# Patient Record
Sex: Male | Born: 1945
Health system: Southern US, Community
[De-identification: ages and names within clinical notes are randomized; demographics above are authoritative.]

## PROBLEM LIST (undated history)

## (undated) DIAGNOSIS — T7840XA Allergy, unspecified, initial encounter: Secondary | ICD-10-CM

## (undated) DIAGNOSIS — E538 Deficiency of other specified B group vitamins: Secondary | ICD-10-CM

## (undated) DIAGNOSIS — K219 Gastro-esophageal reflux disease without esophagitis: Secondary | ICD-10-CM

## (undated) DIAGNOSIS — F419 Anxiety disorder, unspecified: Secondary | ICD-10-CM

## (undated) DIAGNOSIS — E785 Hyperlipidemia, unspecified: Secondary | ICD-10-CM

## (undated) DIAGNOSIS — M1990 Unspecified osteoarthritis, unspecified site: Secondary | ICD-10-CM

## (undated) DIAGNOSIS — G709 Myoneural disorder, unspecified: Secondary | ICD-10-CM

## (undated) HISTORY — DX: Deficiency of other specified B group vitamins: E53.8

## (undated) HISTORY — DX: Myoneural disorder, unspecified: G70.9

## (undated) HISTORY — PX: CHOLECYSTECTOMY: SHX55

## (undated) HISTORY — PX: COLONOSCOPY: SHX174

## (undated) HISTORY — DX: Gastro-esophageal reflux disease without esophagitis: K21.9

## (undated) HISTORY — DX: Unspecified osteoarthritis, unspecified site: M19.90

## (undated) HISTORY — DX: Anxiety disorder, unspecified: F41.9

## (undated) HISTORY — DX: Hyperlipidemia, unspecified: E78.5

## (undated) HISTORY — PX: POLYPECTOMY: SHX149

## (undated) HISTORY — DX: Allergy, unspecified, initial encounter: T78.40XA

---

## 2001-10-10 ENCOUNTER — Ambulatory Visit (HOSPITAL_COMMUNITY): Admission: RE | Admit: 2001-10-10 | Discharge: 2001-10-10 | Payer: Self-pay | Admitting: Internal Medicine

## 2004-05-01 ENCOUNTER — Ambulatory Visit: Payer: Self-pay

## 2010-10-13 ENCOUNTER — Ambulatory Visit: Payer: Self-pay | Admitting: Gastroenterology

## 2011-06-15 DIAGNOSIS — R972 Elevated prostate specific antigen [PSA]: Secondary | ICD-10-CM | POA: Diagnosis not present

## 2011-06-22 DIAGNOSIS — N401 Enlarged prostate with lower urinary tract symptoms: Secondary | ICD-10-CM | POA: Diagnosis not present

## 2011-06-22 DIAGNOSIS — R350 Frequency of micturition: Secondary | ICD-10-CM | POA: Diagnosis not present

## 2011-07-23 ENCOUNTER — Ambulatory Visit (INDEPENDENT_AMBULATORY_CARE_PROVIDER_SITE_OTHER): Payer: Medicare Other | Admitting: Internal Medicine

## 2011-07-23 VITALS — BP 136/81 | HR 89 | Temp 98.6°F | Resp 18 | Ht 74.0 in | Wt 190.0 lb

## 2011-07-23 DIAGNOSIS — J019 Acute sinusitis, unspecified: Secondary | ICD-10-CM | POA: Diagnosis not present

## 2011-07-23 DIAGNOSIS — J329 Chronic sinusitis, unspecified: Secondary | ICD-10-CM

## 2011-07-23 MED ORDER — AMOXICILLIN 500 MG PO CAPS: 1000.0000 mg | ORAL_CAPSULE | Freq: Two times a day (BID) | ORAL | Status: AC

## 2011-07-23 NOTE — Progress Notes (Signed)
  Subjective:    Patient ID: Peter Newman, male    DOB: 03-Jun-1945, 66 y.o.   MRN: [id number]  HPI  Green nasal discharge. No cough  Review of Systems     Objective:   Physical Exam Tender frontal sinuses. No fever and only mild HA.       Assessment & Plan:   Amoxil 1g BID

## 2011-12-26 DIAGNOSIS — N401 Enlarged prostate with lower urinary tract symptoms: Secondary | ICD-10-CM | POA: Diagnosis not present

## 2012-01-12 ENCOUNTER — Other Ambulatory Visit: Payer: Self-pay | Admitting: Physician Assistant

## 2012-01-13 NOTE — Telephone Encounter (Signed)
We've seen this patient once for a sinusitis.  Who is his PCP?  That person should refill his chronic meds.

## 2012-01-14 NOTE — Telephone Encounter (Signed)
LMOM for patient to CB.  Last seen and had labs in 08/2010.  When is he having CPE/appt?

## 2012-01-15 ENCOUNTER — Telehealth: Payer: Self-pay

## 2012-01-15 NOTE — Telephone Encounter (Signed)
Pt needs Vitamin D Multiple Vitamin (MULTIVITAMIN) tablet  [facility] - at [address]   Leaving town today

## 2012-01-16 NOTE — Telephone Encounter (Signed)
[facility] called regarding patient's rx.  The pharmacy stated that they sent over two refill requests last week and have had no response.  Please call the pharmacy at [phone].

## 2012-01-16 NOTE — Telephone Encounter (Signed)
Called patient again, no answer.  Will deny rx.

## 2012-01-17 ENCOUNTER — Telehealth: Payer: Self-pay | Admitting: Radiology

## 2012-01-17 MED ORDER — METANX 3-90.314-2-35 MG PO CAPS: 1.0000 | ORAL_CAPSULE | Freq: Two times a day (BID) | ORAL | Status: DC

## 2012-01-17 NOTE — Telephone Encounter (Signed)
I have spoken to [place] patient walked in for renewal on Metanx okay to renew x1mo but needs ov prior to another renewal. I have called patients home number to advise he needs follow up appt, no answer no vm.

## 2012-01-17 NOTE — Telephone Encounter (Signed)
?   Ok x 1, needs OV/CPE for more.  Last labs done 08/2010

## 2012-01-18 ENCOUNTER — Other Ambulatory Visit: Payer: Self-pay

## 2012-01-18 MED ORDER — METANX 3-90.314-2-35 MG PO CAPS: 1.0000 | ORAL_CAPSULE | Freq: Two times a day (BID) | ORAL | Status: DC

## 2012-02-14 ENCOUNTER — Ambulatory Visit (INDEPENDENT_AMBULATORY_CARE_PROVIDER_SITE_OTHER): Payer: Medicare Other | Admitting: Emergency Medicine

## 2012-02-14 VITALS — BP 112/62 | HR 79 | Temp 98.0°F | Resp 18 | Ht 73.25 in | Wt 189.0 lb

## 2012-02-14 DIAGNOSIS — E785 Hyperlipidemia, unspecified: Secondary | ICD-10-CM

## 2012-02-14 DIAGNOSIS — G589 Mononeuropathy, unspecified: Secondary | ICD-10-CM | POA: Diagnosis not present

## 2012-02-14 DIAGNOSIS — G629 Polyneuropathy, unspecified: Secondary | ICD-10-CM | POA: Insufficient documentation

## 2012-02-14 DIAGNOSIS — D72819 Decreased white blood cell count, unspecified: Secondary | ICD-10-CM | POA: Diagnosis not present

## 2012-02-14 LAB — POCT CBC
Granulocyte percent: 61 %G (ref 37–80)
HCT, POC: 50.3 % (ref 43.5–53.7)
Lymph, poc: 1.3 (ref 0.6–3.4)
MCHC: 31.6 g/dL — AB (ref 31.8–35.4)
MID (cbc): 0.3 (ref 0–0.9)
POC Granulocyte: 2.6 (ref 2–6.9)
POC LYMPH PERCENT: 31 %L (ref 10–50)
Platelet Count, POC: 213 10*3/uL (ref 142–424)
RDW, POC: 13.6 %

## 2012-02-14 LAB — LIPID PANEL
Cholesterol: 170 mg/dL (ref 0–200)
HDL: 51 mg/dL (ref >39)
Total CHOL/HDL Ratio: 3.3 Ratio
VLDL: 19 mg/dL (ref 0–40)

## 2012-02-14 LAB — POCT URINALYSIS DIPSTICK
Ketones, UA: NEGATIVE
Leukocytes, UA: NEGATIVE
Protein, UA: NEGATIVE
Spec Grav, UA: 1.02
Urobilinogen, UA: 0.2
pH, UA: 5.5

## 2012-02-14 LAB — COMPREHENSIVE METABOLIC PANEL
Alkaline Phosphatase: 72 U/L (ref 39–117)
CO2: 28 mEq/L (ref 19–32)
Creat: 1.11 mg/dL (ref 0.50–1.35)
Glucose, Bld: 99 mg/dL (ref 70–99)
Total Bilirubin: 0.4 mg/dL (ref 0.3–1.2)

## 2012-02-14 LAB — TSH: TSH: 2.015 u[IU]/mL (ref 0.350–4.500)

## 2012-02-14 MED ORDER — METANX 3-90.314-2-35 MG PO CAPS: 1.0000 | ORAL_CAPSULE | Freq: Two times a day (BID) | ORAL | Status: DC

## 2012-02-14 NOTE — Progress Notes (Signed)
  Subjective:    Patient ID: Peter Newman, male    DOB: [birth year], 66 y.o.   MRN: [id number]  HPI Patient in today to have his Vitamin B refilled. He states that he does real well on the medication. He is on the medication because he has had a deficiency on blood work. He had to cancel his last appointment with Dr. Perrin Maltese. He will reschedule. He also states that when he is in a dark room and he moves his head from side to side in the peripheral areas on both sides he see a light.     Review of Systems     Objective:   Physical Exam HEENT exam is unremarkable. Neck supple without carotid bruits. Chest was clear to auscultation. Cardiac exam is a regular rate without murmurs. Deep tendon reflexes in the lower extremities are 2+ and he has a normal filament test         Assessment & Plan:  Screening labs were done today for patient to have at the time of his physical. I will go ahead and refill his metanx. He was advised to make an appointment to see Dr. Perrin Maltese for full physical exam.

## 2012-02-14 NOTE — Patient Instructions (Signed)
Please make an appointment to see Dr. Perrin Maltese to have a physical exam done.

## 2012-02-20 DIAGNOSIS — H5319 Other subjective visual disturbances: Secondary | ICD-10-CM | POA: Diagnosis not present

## 2012-02-20 DIAGNOSIS — H43819 Vitreous degeneration, unspecified eye: Secondary | ICD-10-CM | POA: Diagnosis not present

## 2012-06-09 ENCOUNTER — Encounter: Payer: MEDICARE | Admitting: Internal Medicine

## 2012-08-25 ENCOUNTER — Ambulatory Visit: Payer: Medicare Other

## 2012-08-25 ENCOUNTER — Encounter: Payer: Self-pay | Admitting: Internal Medicine

## 2012-08-25 ENCOUNTER — Ambulatory Visit (INDEPENDENT_AMBULATORY_CARE_PROVIDER_SITE_OTHER): Payer: Medicare Other | Admitting: Internal Medicine

## 2012-08-25 VITALS — BP 113/71 | HR 83 | Temp 98.3°F | Resp 16 | Ht 73.5 in | Wt 194.0 lb

## 2012-08-25 DIAGNOSIS — Z139 Encounter for screening, unspecified: Secondary | ICD-10-CM | POA: Diagnosis not present

## 2012-08-25 DIAGNOSIS — Z23 Encounter for immunization: Secondary | ICD-10-CM | POA: Diagnosis not present

## 2012-08-25 DIAGNOSIS — K589 Irritable bowel syndrome without diarrhea: Secondary | ICD-10-CM

## 2012-08-25 DIAGNOSIS — Z0000 Encounter for general adult medical examination without abnormal findings: Secondary | ICD-10-CM | POA: Diagnosis not present

## 2012-08-25 DIAGNOSIS — E559 Vitamin D deficiency, unspecified: Secondary | ICD-10-CM | POA: Diagnosis not present

## 2012-08-25 LAB — COMPREHENSIVE METABOLIC PANEL
Albumin: 4.1 g/dL (ref 3.5–5.2)
Alkaline Phosphatase: 71 U/L (ref 39–117)
BUN: 18 mg/dL (ref 6–23)
Creat: 1.18 mg/dL (ref 0.50–1.35)
Glucose, Bld: 105 mg/dL — ABNORMAL HIGH (ref 70–99)
Total Bilirubin: 0.6 mg/dL (ref 0.3–1.2)

## 2012-08-25 LAB — LIPID PANEL
Cholesterol: 201 mg/dL — ABNORMAL HIGH (ref 0–200)
HDL: 53 mg/dL (ref >39)
LDL Cholesterol: 130 mg/dL — ABNORMAL HIGH (ref 0–99)
Triglycerides: 89 mg/dL (ref <150)
VLDL: 18 mg/dL (ref 0–40)

## 2012-08-25 LAB — CBC WITH DIFFERENTIAL/PLATELET
Basophils Relative: 1 % (ref 0–1)
Eosinophils Absolute: 0.1 10*3/uL (ref 0.0–0.7)
Eosinophils Relative: 2 % (ref 0–5)
HCT: 48.3 % (ref 39.0–52.0)
Hemoglobin: 16.4 g/dL (ref 13.0–17.0)
MCH: 30.7 pg (ref 26.0–34.0)
MCHC: 34 g/dL (ref 30.0–36.0)
Monocytes Absolute: 0.3 10*3/uL (ref 0.1–1.0)
Monocytes Relative: 8 % (ref 3–12)

## 2012-08-25 LAB — POCT URINALYSIS DIPSTICK
Ketones, UA: NEGATIVE
Leukocytes, UA: NEGATIVE
Protein, UA: NEGATIVE
Urobilinogen, UA: 0.2

## 2012-08-25 LAB — POCT UA - MICROSCOPIC ONLY
WBC, Ur, HPF, POC: NEGATIVE
Yeast, UA: NEGATIVE

## 2012-08-25 LAB — PULMONARY FUNCTION TEST

## 2012-08-25 NOTE — Progress Notes (Signed)
  Subjective:    Patient ID: Peter Newman, male    DOB: 01-12-1946, 67 y.o.   MRN: [id number]  HPI CPE healthy on no meds. Elevated PSA followed by urology  Review of Systems  Constitutional: Negative.   HENT: Negative.   Eyes: Negative.   Respiratory: Negative.   Cardiovascular: Negative.   Gastrointestinal: Negative.   Endocrine: Negative.   Genitourinary: Positive for frequency.  Musculoskeletal: Negative.   Allergic/Immunologic: Negative.   Neurological: Negative.   Hematological: Negative.   Psychiatric/Behavioral: Negative.        Objective:   Physical Exam  Constitutional: He is oriented to person, place, and time. He appears well-developed and well-nourished.  HENT:  Right Ear: External ear normal.  Left Ear: External ear normal.  Nose: Nose normal.  Mouth/Throat: Oropharynx is clear and moist.  Eyes: Conjunctivae and EOM are normal. Pupils are equal, round, and reactive to light. No scleral icterus.  Neck: Neck supple. No tracheal deviation present. No thyromegaly present.  Cardiovascular: Normal rate, regular rhythm, normal heart sounds and intact distal pulses.   Pulmonary/Chest: Effort normal and breath sounds normal.  Abdominal: Soft. Bowel sounds are normal. He exhibits no mass. There is no guarding.  Genitourinary: Penis normal.  Musculoskeletal: Normal range of motion.  Lymphadenopathy:    He has no cervical adenopathy.  Neurological: He is alert and oriented to person, place, and time. He has normal reflexes. No cranial nerve deficit. He exhibits normal muscle tone. Coordination normal.  Skin: No rash noted.  Psychiatric: He has a normal mood and affect. His behavior is normal. Judgment and thought content normal.     [facility] reading (PRIMARY) by  Dr Rodrigo Ran cxr Results for orders placed in visit on 08/25/12  POCT URINALYSIS DIPSTICK      Result Value Range   Color, UA yellow     Clarity, UA clear     Glucose, UA neg     Bilirubin, UA neg      Ketones, UA neg     Spec Grav, UA 1.025     Blood, UA neg     pH, UA 5.5     Protein, UA neg     Urobilinogen, UA 0.2     Nitrite, UA neg     Leukocytes, UA Negative    POCT UA - MICROSCOPIC ONLY      Result Value Range   WBC, Ur, HPF, POC neg     RBC, urine, microscopic neg     Bacteria, U Microscopic neg     Mucus, UA neg     Epithelial cells, urine per micros 0-1     Crystals, Ur, HPF, POC neg     Casts, Ur, LPF, POC neg     Yeast, UA neg      EKG and PFTs normal      Assessment & Plan:  cpe 1y

## 2012-08-26 ENCOUNTER — Encounter: Payer: Self-pay | Admitting: *Deleted

## 2013-02-08 DIAGNOSIS — Z23 Encounter for immunization: Secondary | ICD-10-CM | POA: Diagnosis not present

## 2013-03-12 ENCOUNTER — Telehealth: Payer: Self-pay | Admitting: Gastroenterology

## 2013-03-12 NOTE — Telephone Encounter (Signed)
Colon on 12/03/2003 was normal with a recall for 11/2013. lmom for pt to call back.

## 2013-03-17 NOTE — Telephone Encounter (Signed)
Informed pt he cannot schedule until July, 2015 unless he is having changes or problems in his GI system; pt stated understanding.

## 2013-09-14 ENCOUNTER — Encounter: Payer: Medicare Other | Admitting: Internal Medicine

## 2013-10-26 ENCOUNTER — Other Ambulatory Visit: Payer: Self-pay | Admitting: Internal Medicine

## 2013-10-26 ENCOUNTER — Ambulatory Visit (INDEPENDENT_AMBULATORY_CARE_PROVIDER_SITE_OTHER): Payer: Medicare Other | Admitting: Internal Medicine

## 2013-10-26 VITALS — BP 118/80 | HR 83 | Temp 98.5°F | Resp 16 | Ht 73.0 in | Wt 196.0 lb

## 2013-10-26 DIAGNOSIS — N644 Mastodynia: Secondary | ICD-10-CM

## 2013-10-26 MED ORDER — TRIAMCINOLONE ACETONIDE 0.1 % EX CREA: 1.0000 "application " | TOPICAL_CREAM | Freq: Two times a day (BID) | CUTANEOUS | Status: DC

## 2013-10-26 NOTE — Progress Notes (Signed)
   Subjective:    Patient ID: Peter Newman, male    DOB: 11-23-1945, 68 y.o.   MRN: [id number]  HPI  68 year old Caucasian male presents to clinic with right sided breast pain.  No known injury.  Pt states nipple gets sore without drainage.  No palpable findings.  Exam is normal.  Uses ibuprofen and Nyquil prn    Review of Systems     Objective:   Physical Exam  Constitutional: He is oriented to person, place, and time. He appears well-developed and well-nourished. No distress.  HENT:  Head: Normocephalic.  Eyes: EOM are normal. Pupils are equal, round, and reactive to light.  Neck: Normal range of motion.  Cardiovascular: Normal rate, regular rhythm and normal heart sounds.   Pulmonary/Chest: Effort normal and breath sounds normal. He exhibits tenderness. He exhibits no mass, no bony tenderness, no crepitus, no edema, no deformity, no swelling and no retraction. Right breast exhibits tenderness. Right breast exhibits no inverted nipple, no mass, no nipple discharge and no skin change. Left breast exhibits no inverted nipple, no mass, no nipple discharge, no skin change and no tenderness. Breasts are symmetrical.    Tender nipple No erythema or skin changes  Neurological: He is alert and oriented to person, place, and time. He exhibits normal muscle tone. Coordination normal.  Psychiatric: He has a normal mood and affect. His behavior is normal. Judgment and thought content normal.          Assessment & Plan:  Triamcinolone cream prn Refer for mammogram RTC if not well after

## 2013-10-26 NOTE — Progress Notes (Signed)
   Subjective:    Patient ID: Peter Newman, male    DOB: [birth year], 68 y.o.   MRN: [id number]  HPI    Review of Systems     Objective:   Physical Exam        Assessment & Plan:

## 2013-10-26 NOTE — Patient Instructions (Signed)
Breast Cancer, Male Breast cancer occurs when abnormal cells within the breast tissue begin to divide rapidly and uncontrollably. While most people think of breast cancer as a woman's disease, men can also develop breast cancer, because they also have breast tissue. Breast cancer is a rare problem in men. CAUSES  The exact cause of breast cancer is not known. It is believed that breast cancer occurs due to many factors. Men are at a higher risk of developing breast cancer if they have:  Other family members who have had breast cancer.  Changes in certain genes (such as BRCA1 or BRCA2).  A history of radiation exposure, such as treatment for another type of cancer.  Higher than normal levels of the hormone estrogen in their bodies. This may be due to:  Unknown origins.  Treatment with estrogen-containing drugs.  Cirrhosis of the liver.  Being overweight (obese).  Klinefelter's syndrome (chromosome abnormality).  Large intake of alcohol.  Abnormalities of the testicles:  Past history of mumps.  Undescended testicle.  Surgical removal of the testicles.  Certain occupational exposures (not proven, but may be associated with male breast cancer). Such exposures may include:  High temperatures.  Gasoline fumes. SYMPTOMS  Symptoms can include:  A lump or mass in the breast.  Skin changes, such as puckering or dimpling.  Nipple abnormalities, such as scaling, crustiness, excess redness, or pulling in (retraction).  Nipple discharge that may be bloody or clear.  Bone pain.  Weakness.  Weight loss.  Fevers.  Severe tiredness (fatigue). DIAGNOSIS  A physical exam involves:   Carefully feeling the breast tissue to find a mass.  Feeling under both arms to see if any of the lymph nodes are enlarged.  Sampling nipple discharge. This may be sent to a lab to be examined. Imaging tests that are often used to diagnose breast cancer include:  Mammogram. This test uses  radiation to produce a picture of the breast tissue.  Breast ultrasound. This test uses high-frequency sound waves to produce a detailed image of the breast tissue. A biopsy removes a piece of breast or tumor tissue to be examined in a lab. This can reveal the presence of cancer cells. Lymph nodes near the mass and in the armpit may also be removed to see if the cancer has spread. Types of biopsies include:   Fine needle aspiration. A tiny, thin needle is used to remove a sample of tissue.  Core needle biopsy. A larger, hollow needle removes a sample of tissue.  Excisional biopsy. Surgery is used to remove the entire mass, as well as a rim of surrounding breast tissue. TREATMENT  Treatment of breast cancer depends on many factors, including:   Characteristics of the cancer cells.  Tumor size.  Whether the cancer has spread to lymph nodes or other locations, such as the bone. The types of treatments used for breast cancer include:  Surgery to remove as much of the cancer as possible.  Chemotherapy (medicines) to kill cancer cells.  Radiation therapy to kill cancer cells.  Hormone therapy to interfere with the growth of cancer cells.  Targeted therapy to interrupt chemical processes that the cancer cells need in order to grow and divide. HOME CARE INSTRUCTIONS   Take medicine as prescribed by your caregiver.  Maintain a healthy diet.  Consider joining a support group. This may help you learn to cope with the stress of having breast cancer.  Seek advice to help you manage treatment side effects. [place]  CARE IF:   You have a sudden increase in pain.  You notice a new mass in either breast or under your arm.  You develop swelling in either arm or hand.  You lose weight without trying.  You have a fever.  You notice new fatigue or weakness.  You develop chest pain or trouble breathing.  You faint. Document Released: 04/19/2008 Document Revised:  07/30/2011 Document Reviewed: 04/19/2008 [facility] Patient Information 2014 [place].

## 2013-10-30 ENCOUNTER — Ambulatory Visit
Admission: RE | Admit: 2013-10-30 | Discharge: 2013-10-30 | Disposition: A | Discharge status: Home / Self Care | Payer: Medicare Other | Source: Ambulatory Visit | Attending: Internal Medicine | Admitting: Internal Medicine

## 2013-10-30 ENCOUNTER — Other Ambulatory Visit: Payer: MEDICARE

## 2013-10-30 DIAGNOSIS — R928 Other abnormal and inconclusive findings on diagnostic imaging of breast: Secondary | ICD-10-CM | POA: Diagnosis not present

## 2013-10-30 DIAGNOSIS — N644 Mastodynia: Secondary | ICD-10-CM

## 2013-11-06 DIAGNOSIS — J019 Acute sinusitis, unspecified: Secondary | ICD-10-CM | POA: Diagnosis not present

## 2013-11-24 ENCOUNTER — Other Ambulatory Visit: Payer: Self-pay | Admitting: Physician Assistant

## 2013-12-01 ENCOUNTER — Encounter: Payer: Self-pay | Admitting: Gastroenterology

## 2013-12-14 ENCOUNTER — Other Ambulatory Visit: Payer: MEDICARE

## 2013-12-24 DIAGNOSIS — I781 Nevus, non-neoplastic: Secondary | ICD-10-CM | POA: Diagnosis not present

## 2013-12-24 DIAGNOSIS — L819 Disorder of pigmentation, unspecified: Secondary | ICD-10-CM | POA: Diagnosis not present

## 2013-12-24 DIAGNOSIS — L290 Pruritus ani: Secondary | ICD-10-CM | POA: Diagnosis not present

## 2013-12-24 DIAGNOSIS — L821 Other seborrheic keratosis: Secondary | ICD-10-CM | POA: Diagnosis not present

## 2014-01-22 IMAGING — CR DG CHEST 2V
2 series · 2 of 2 positions shown · non-contrast
Comparison: None.

CLINICAL DATA: CPE,

CHEST - 2 VIEW

[lateral]
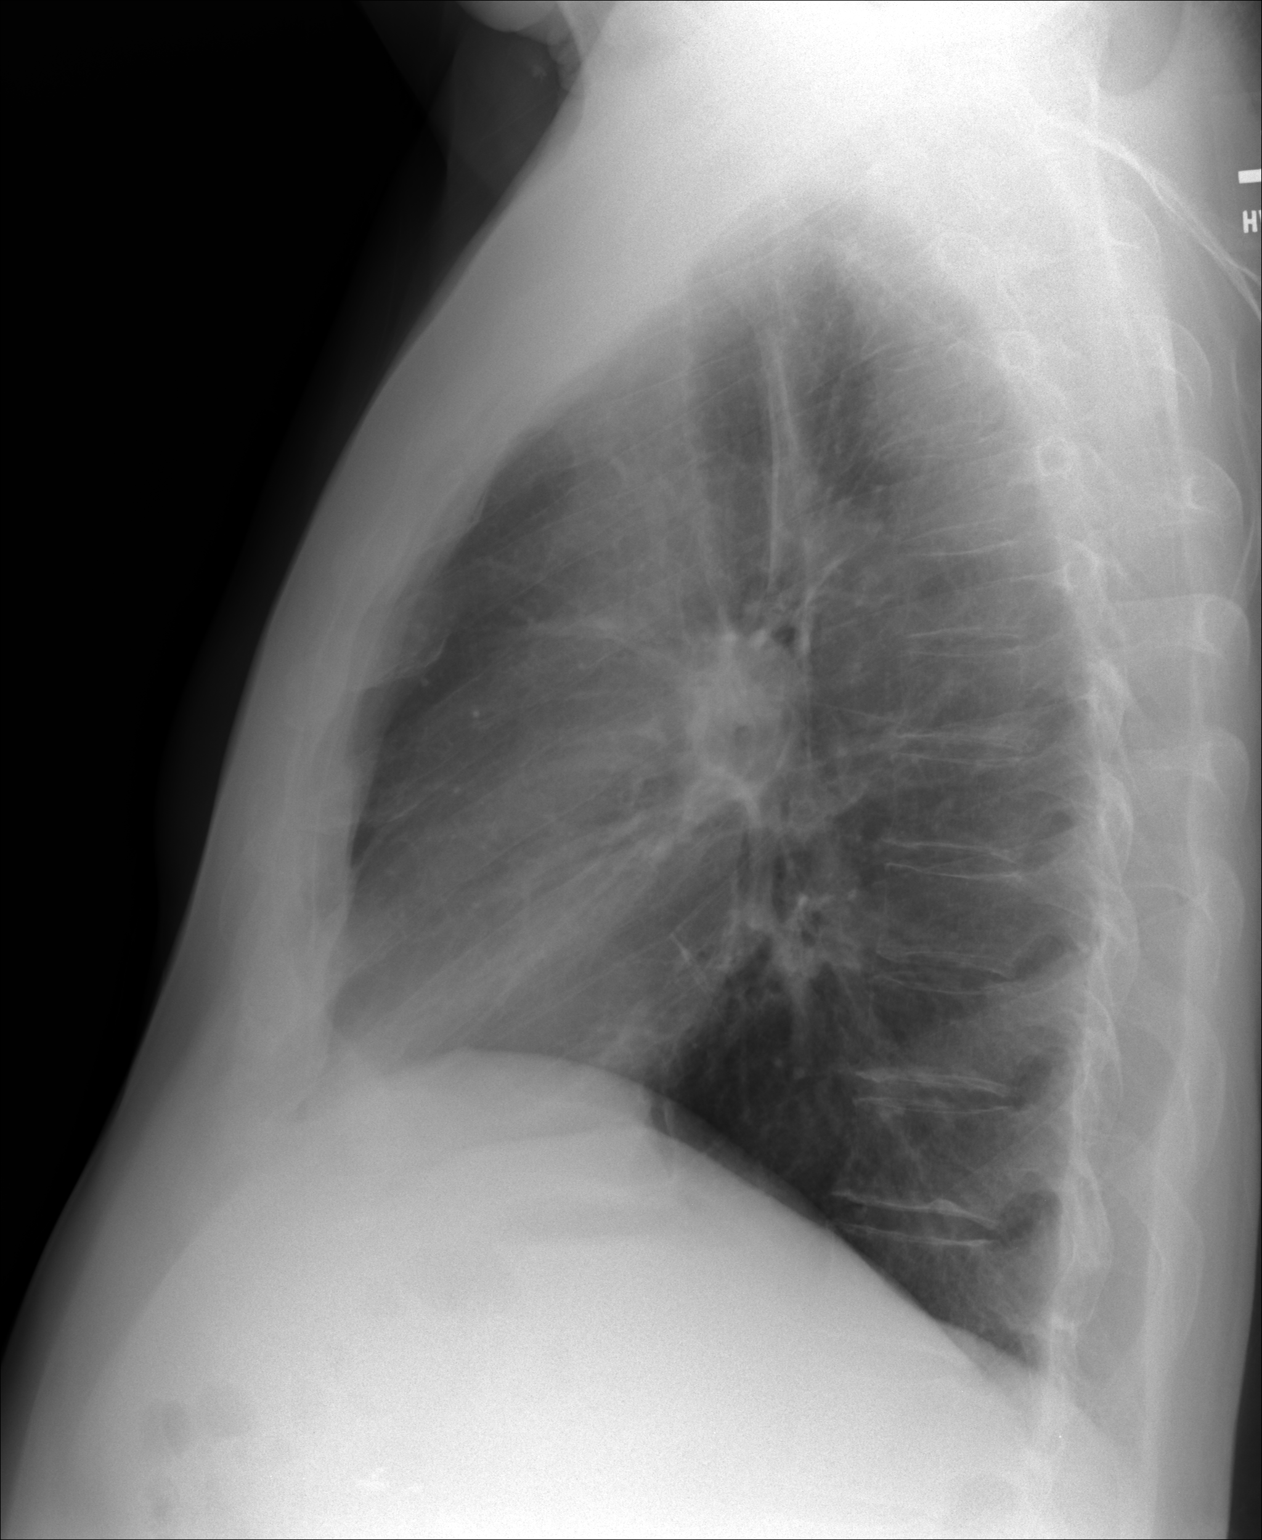

[PA]
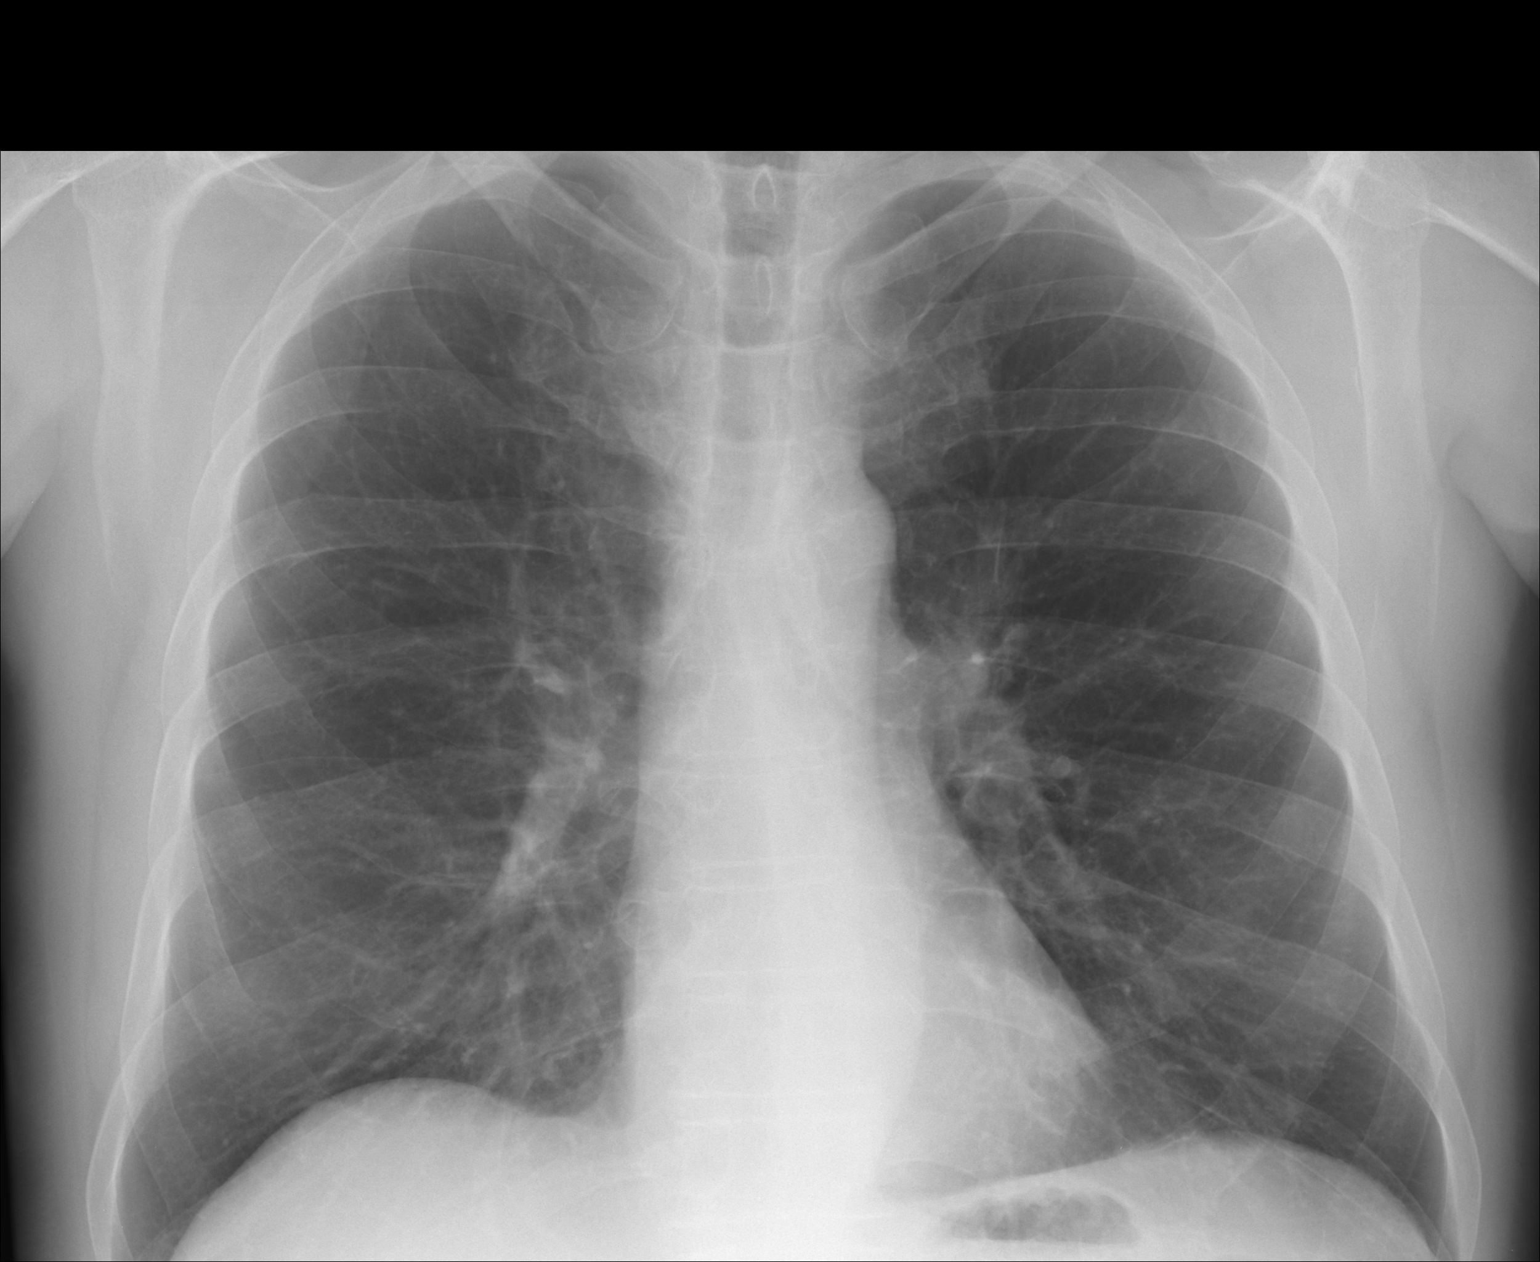

[2 of 2 positions shown; findings below may reference images not displayed]

FINDINGS: The heart size and mediastinal contours are within
normal limits.  Both lungs are clear.  The visualized skeletal
structures are unremarkable.
IMPRESSION: No active cardiopulmonary disease.

## 2014-01-23 ENCOUNTER — Ambulatory Visit (INDEPENDENT_AMBULATORY_CARE_PROVIDER_SITE_OTHER): Payer: Medicare Other | Admitting: Internal Medicine

## 2014-01-23 VITALS — BP 120/78 | HR 72 | Temp 98.3°F | Resp 16 | Ht 73.5 in | Wt 194.0 lb

## 2014-01-23 DIAGNOSIS — Z1211 Encounter for screening for malignant neoplasm of colon: Secondary | ICD-10-CM

## 2014-01-23 DIAGNOSIS — Z0000 Encounter for general adult medical examination without abnormal findings: Secondary | ICD-10-CM | POA: Diagnosis not present

## 2014-01-23 DIAGNOSIS — E538 Deficiency of other specified B group vitamins: Secondary | ICD-10-CM

## 2014-01-23 DIAGNOSIS — E785 Hyperlipidemia, unspecified: Secondary | ICD-10-CM

## 2014-01-23 DIAGNOSIS — Z125 Encounter for screening for malignant neoplasm of prostate: Secondary | ICD-10-CM | POA: Diagnosis not present

## 2014-01-23 DIAGNOSIS — Z23 Encounter for immunization: Secondary | ICD-10-CM

## 2014-01-23 DIAGNOSIS — E559 Vitamin D deficiency, unspecified: Secondary | ICD-10-CM | POA: Diagnosis not present

## 2014-01-23 LAB — LIPID PANEL
CHOL/HDL RATIO: 3.8 ratio
Cholesterol: 195 mg/dL (ref 0–200)
HDL: 52 mg/dL (ref >39)
LDL Cholesterol: 122 mg/dL — ABNORMAL HIGH (ref 0–99)
Triglycerides: 107 mg/dL (ref <150)
VLDL: 21 mg/dL (ref 0–40)

## 2014-01-23 LAB — COMPREHENSIVE METABOLIC PANEL
ALK PHOS: 71 U/L (ref 39–117)
ALT: 22 U/L (ref 0–53)
AST: 18 U/L (ref 0–37)
Albumin: 4.3 g/dL (ref 3.5–5.2)
BUN: 16 mg/dL (ref 6–23)
CO2: 28 mEq/L (ref 19–32)
Calcium: 9.3 mg/dL (ref 8.4–10.5)
Chloride: 104 mEq/L (ref 96–112)
Creat: 1.12 mg/dL (ref 0.50–1.35)
Glucose, Bld: 116 mg/dL — ABNORMAL HIGH (ref 70–99)
Potassium: 4.9 mEq/L (ref 3.5–5.3)
SODIUM: 138 meq/L (ref 135–145)
TOTAL PROTEIN: 7.1 g/dL (ref 6.0–8.3)
Total Bilirubin: 0.5 mg/dL (ref 0.2–1.2)

## 2014-01-23 LAB — POCT URINALYSIS DIPSTICK
Bilirubin, UA: NEGATIVE
Glucose, UA: NEGATIVE
KETONES UA: NEGATIVE
Leukocytes, UA: NEGATIVE
NITRITE UA: NEGATIVE
Protein, UA: NEGATIVE
Spec Grav, UA: 1.02
Urobilinogen, UA: 0.2
pH, UA: 6.5

## 2014-01-23 LAB — POCT UA - MICROSCOPIC ONLY
Bacteria, U Microscopic: NEGATIVE
CRYSTALS, UR, HPF, POC: NEGATIVE
Casts, Ur, LPF, POC: NEGATIVE
Mucus, UA: NEGATIVE
WBC, Ur, HPF, POC: NEGATIVE
YEAST UA: NEGATIVE

## 2014-01-23 LAB — POCT CBC
Granulocyte percent: 65.4 %G (ref 37–80)
HCT, POC: 51.1 % (ref 43.5–53.7)
Hemoglobin: 16.7 g/dL (ref 14.1–18.1)
Lymph, poc: 1.2 (ref 0.6–3.4)
MCH, POC: 30.8 pg (ref 27–31.2)
MCHC: 32.7 g/dL (ref 31.8–35.4)
MCV: 94.4 fL (ref 80–97)
MID (CBC): 0.2 (ref 0–0.9)
MPV: 6.9 fL (ref 0–99.8)
PLATELET COUNT, POC: 208 10*3/uL (ref 142–424)
POC Granulocyte: 2.7 (ref 2–6.9)
POC LYMPH PERCENT: 29.4 %L (ref 10–50)
POC MID %: 5.2 % (ref 0–12)
RBC: 5.42 M/uL (ref 4.69–6.13)
RDW, POC: 13.2 %
WBC: 4.2 10*3/uL — AB (ref 4.6–10.2)

## 2014-01-23 LAB — TSH: TSH: 1.243 u[IU]/mL (ref 0.350–4.500)

## 2014-01-23 NOTE — Progress Notes (Signed)
Subjective:    Patient ID: Peter Newman, male    DOB: [birth year], 68 y.o.   MRN: [id number]  HPI Doing well, Dr. Tonia Brooms did complete skin screen all ok. Needs flu vac today, will also give prevnar   Review of Systems  Constitutional: Negative.   HENT: Negative.   Eyes: Negative.   Respiratory: Negative.   Cardiovascular: Negative.   Gastrointestinal: Negative.   Endocrine: Negative.   Genitourinary: Positive for frequency.  Musculoskeletal: Positive for back pain.  Skin: Negative.   Allergic/Immunologic: Positive for environmental allergies.  Neurological: Negative.   Hematological: Negative.   Psychiatric/Behavioral: Negative.        Objective:   Physical Exam  Constitutional: He is oriented to person, place, and time. He appears well-developed and well-nourished.  HENT:  Head: Normocephalic and atraumatic.  Right Ear: External ear normal.  Left Ear: External ear normal.  Nose: Nose normal.  Mouth/Throat: Oropharynx is clear and moist.  Eyes: Conjunctivae and EOM are normal. Pupils are equal, round, and reactive to light.  Neck: Normal range of motion. Neck supple. No tracheal deviation present. No thyromegaly present.  Cardiovascular: Normal rate, regular rhythm, normal heart sounds and intact distal pulses.   Pulmonary/Chest: Effort normal and breath sounds normal.  Abdominal: Soft. Bowel sounds are normal. He exhibits no mass. There is no tenderness.  Genitourinary: Rectum normal, prostate normal and penis normal.  Musculoskeletal: Normal range of motion.  Neurological: He is alert and oriented to person, place, and time. He has normal reflexes. No cranial nerve deficit. He exhibits normal muscle tone. Coordination normal.  Skin: No rash noted.  Psychiatric: He has a normal mood and affect. His behavior is normal. Judgment and thought content normal.   Results for orders placed in visit on 01/23/14  POCT CBC      Result Value Ref Range   WBC 4.2 (*) 4.6 -  10.2 K/uL   Lymph, poc 1.2  0.6 - 3.4   POC LYMPH PERCENT 29.4  10 - 50 %L   MID (cbc) 0.2  0 - 0.9   POC MID % 5.2  0 - 12 %M   POC Granulocyte 2.7  2 - 6.9   Granulocyte percent 65.4  37 - 80 %G   RBC 5.42  4.69 - 6.13 M/uL   Hemoglobin 16.7  14.1 - 18.1 g/dL   HCT, POC 51.1  43.5 - 53.7 %   MCV 94.4  80 - 97 fL   MCH, POC 30.8  27 - 31.2 pg   MCHC 32.7  31.8 - 35.4 g/dL   RDW, POC 13.2     Platelet Count, POC 208  142 - 424 K/uL   MPV 6.9  0 - 99.8 fL  POCT UA - MICROSCOPIC ONLY      Result Value Ref Range   WBC, Ur, HPF, POC neg     RBC, urine, microscopic 1-3     Bacteria, U Microscopic neg     Mucus, UA neg     Epithelial cells, urine per micros 0-1     Crystals, Ur, HPF, POC neg     Casts, Ur, LPF, POC neg     Yeast, UA neg    POCT URINALYSIS DIPSTICK      Result Value Ref Range   Color, UA yellow     Clarity, UA clear     Glucose, UA neg     Bilirubin, UA neg     Ketones, UA neg  Spec Grav, UA 1.020     Blood, UA trace-intact     pH, UA 6.5     Protein, UA neg     Urobilinogen, UA 0.2     Nitrite, UA neg     Leukocytes, UA Negative            Assessment & Plan:  Healthy yearly exam

## 2014-01-23 NOTE — Patient Instructions (Addendum)
DASH Eating Plan DASH stands for "Dietary Approaches to Stop Hypertension." The DASH eating plan is a healthy eating plan that has been shown to reduce high blood pressure (hypertension). Additional health benefits may include reducing the risk of type 2 diabetes mellitus, heart disease, and stroke. The DASH eating plan may also help with weight loss. WHAT DO I NEED TO KNOW ABOUT THE DASH EATING PLAN? For the DASH eating plan, you will follow these general guidelines:  Choose foods with a percent daily value for sodium of less than 5% (as listed on the food label).  Use salt-free seasonings or herbs instead of table salt or sea salt.  Check with your health care provider or pharmacist before using salt substitutes.  Eat lower-sodium products, often labeled as "lower sodium" or "no salt added."  Eat fresh foods.  Eat more vegetables, fruits, and low-fat dairy products.  Choose whole grains. Look for the word "whole" as the first word in the ingredient list.  Choose fish and skinless chicken or [place] more often than red meat. Limit fish, poultry, and meat to 6 oz (170 g) each day.  Limit sweets, desserts, sugars, and sugary drinks.  Choose heart-healthy fats.  Limit cheese to 1 oz (28 g) per day.  Eat more home-cooked food and less restaurant, buffet, and fast food.  Limit fried foods.  Cook foods using methods other than frying.  Limit canned vegetables. If you do use them, rinse them well to decrease the sodium.  When eating at a restaurant, ask that your food be prepared with less salt, or no salt if possible. WHAT FOODS CAN I EAT? Seek help from a dietitian for individual calorie needs. Grains Whole grain or whole wheat bread. Brown rice. Whole grain or whole wheat pasta. Quinoa, bulgur, and whole grain cereals. Low-sodium cereals. Corn or whole wheat flour tortillas. Whole grain cornbread. Whole grain crackers. Low-sodium crackers. Vegetables Fresh or frozen vegetables  (raw, steamed, roasted, or grilled). Low-sodium or reduced-sodium tomato and vegetable juices. Low-sodium or reduced-sodium tomato sauce and paste. Low-sodium or reduced-sodium canned vegetables.  Fruits All fresh, canned (in natural juice), or frozen fruits. Meat and Other Protein Products Ground beef (85% or leaner), grass-fed beef, or beef trimmed of fat. Skinless chicken or [place]. Ground chicken or [place]. Pork trimmed of fat. All fish and seafood. Eggs. Dried beans, peas, or lentils. Unsalted nuts and seeds. Unsalted canned beans. Dairy Low-fat dairy products, such as skim or 1% milk, 2% or reduced-fat cheeses, low-fat ricotta or cottage cheese, or plain low-fat yogurt. Low-sodium or reduced-sodium cheeses. Fats and Oils Tub margarines without trans fats. Light or reduced-fat mayonnaise and salad dressings (reduced sodium). Avocado. Safflower, olive, or canola oils. Natural peanut or almond butter. Other Unsalted popcorn and pretzels. The items listed above may not be a complete list of recommended foods or beverages. Contact your dietitian for more options. WHAT FOODS ARE NOT RECOMMENDED? Grains White bread. White pasta. White rice. Refined cornbread. Bagels and croissants. Crackers that contain trans fat. Vegetables Creamed or fried vegetables. Vegetables in a cheese sauce. Regular canned vegetables. Regular canned tomato sauce and paste. Regular tomato and vegetable juices. Fruits Dried fruits. Canned fruit in light or heavy syrup. Fruit juice. Meat and Other Protein Products Fatty cuts of meat. Ribs, chicken wings, bacon, sausage, bologna, salami, chitterlings, fatback, hot dogs, bratwurst, and packaged luncheon meats. Salted nuts and seeds. Canned beans with salt. Dairy Whole or 2% milk, cream, half-and-half, and cream cheese. Whole-fat or sweetened yogurt. Full-fat  cheeses or blue cheese. Nondairy creamers and whipped toppings. Processed cheese, cheese spreads, or cheese  curds. Condiments Onion and garlic salt, seasoned salt, table salt, and sea salt. Canned and packaged gravies. Worcestershire sauce. Tartar sauce. Barbecue sauce. Teriyaki sauce. Soy sauce, including reduced sodium. Steak sauce. Fish sauce. Oyster sauce. Cocktail sauce. Horseradish. Ketchup and mustard. Meat flavorings and tenderizers. Bouillon cubes. Hot sauce. Tabasco sauce. Marinades. Taco seasonings. Relishes. Fats and Oils Butter, stick margarine, lard, shortening, ghee, and bacon fat. Coconut, palm kernel, or palm oils. Regular salad dressings. Other Pickles and olives. Salted popcorn and pretzels. The items listed above may not be a complete list of foods and beverages to avoid. Contact your dietitian for more information. WHERE CAN I FIND MORE INFORMATION? National [employer], [employer], [employer] Institute: travelstabloid.com Document Released: 04/26/2011 Document Revised: 09/21/2013 Document Reviewed: 03/11/2013 [facility] Patient Information 2015 [place], [place]. This information is not intended to replace advice given to you by your health care provider. Make sure you discuss any questions you have with your health care provider. Stress Stress-related medical problems are becoming increasingly common. The body has a built-in physical response to stressful situations. Faced with pressure, challenge or danger, we need to react quickly. Our bodies release hormones such as cortisol and adrenaline to help do this. These hormones are part of the "fight or flight" response and affect the metabolic rate, heart rate and blood pressure, resulting in a heightened, stressed state that prepares the body for optimum performance in dealing with a stressful situation. It is likely that early man required these mechanisms to stay alive, but usually modern stresses do not call for this, and the same hormones released in today's world can damage health and reduce coping  ability. CAUSES  Pressure to perform at work, at school or in sports.  Threats of physical violence.  Money worries.  Arguments.  Family conflicts.  Divorce or separation from significant other.  Bereavement.  New job or unemployment.  Changes in location.  Alcohol or drug abuse. SOMETIMES, THERE IS NO PARTICULAR REASON FOR DEVELOPING STRESS. Almost all people are at risk of being stressed at some time in their lives. It is important to know that some stress is temporary and some is long term.  Temporary stress will go away when a situation is resolved. Most people can cope with short periods of stress, and it can often be relieved by relaxing, taking a walk or getting any type of exercise, chatting through issues with friends, or having a good night's sleep.  Chronic (long-term, continuous) stress is much harder to deal with. It can be psychologically and emotionally damaging. It can be harmful both for an individual and for friends and family. SYMPTOMS Everyone reacts to stress differently. There are some common effects that help [place] recognize it. In times of extreme stress, people may:  Shake uncontrollably.  Breathe faster and deeper than normal (hyperventilate).  Vomit.  For people with asthma, stress can trigger an attack.  For some people, stress may trigger migraine headaches, ulcers, and body pain. PHYSICAL EFFECTS OF STRESS MAY INCLUDE:  Loss of energy.  Skin problems.  Aches and pains resulting from tense muscles, including neck ache, backache and tension headaches.  Increased pain from arthritis and other conditions.  Irregular heart beat (palpitations).  Periods of irritability or anger.  Apathy or depression.  Anxiety (feeling uptight or worrying).  Unusual behavior.  Loss of appetite.  Comfort eating.  Lack of concentration.  Loss of, or decreased, sex-drive.  Increased smoking, drinking, or recreational drug use.  For women, missed  periods.  Ulcers, joint pain, and muscle pain. Post-traumatic stress is the stress caused by any serious accident, strong emotional damage, or extremely difficult or violent experience such as rape or war. Post-traumatic stress victims can experience mixtures of emotions such as fear, shame, depression, guilt or anger. It may include recurrent memories or images that may be haunting. These feelings can last for weeks, months or even years after the traumatic event that triggered them. Specialized treatment, possibly with medicines and psychological therapies, is available. If stress is causing physical symptoms, severe distress or making it difficult for you to function as normal, it is worth seeing your caregiver. It is important to remember that although stress is a usual part of life, extreme or prolonged stress can lead to other illnesses that will need treatment. It is better to visit a doctor sooner rather than later. Stress has been linked to the development of high blood pressure and heart disease, as well as insomnia and depression. There is no diagnostic test for stress since everyone reacts to it differently. But a caregiver will be able to spot the physical symptoms, such as:  Headaches.  Shingles.  Ulcers. Emotional distress such as intense worry, low mood or irritability should be detected when the doctor asks pertinent questions to identify any underlying problems that might be the cause. In case there are physical reasons for the symptoms, the doctor may also want to do some tests to exclude certain conditions. If you feel that you are suffering from stress, try to identify the aspects of your life that are causing it. Sometimes you may not be able to change or avoid them, but even a small change can have a positive ripple effect. A simple lifestyle change can make all the difference. STRATEGIES THAT CAN HELP DEAL WITH STRESS:  Delegating or sharing responsibilities.  Avoiding  confrontations.  Learning to be more assertive.  Regular exercise.  Avoid using alcohol or street drugs to cope.  Eating a healthy, balanced diet, rich in fruit and vegetables and proteins.  Finding humor or absurdity in stressful situations.  Never taking on more than you know you can handle comfortably.  Organizing your time better to get as much done as possible.  Talking to friends or family and sharing your thoughts and fears.  Listening to music or relaxation tapes.  Relaxation techniques like deep breathing, meditation, and yoga.  Tensing and then relaxing your muscles, starting at the toes and working up to the head and neck. If you think that you would benefit from help, either in identifying the things that are causing your stress or in learning techniques to help you relax, see a caregiver who is capable of helping you with this. Rather than relying on medications, it is usually better to try and identify the things in your life that are causing stress and try to deal with them. There are many techniques of managing stress including counseling, psychotherapy, aromatherapy, yoga, and exercise. Your caregiver can help you determine what is best for you. Document Released: 07/28/2002 Document Revised: 05/12/2013 Document Reviewed: 06/24/2007 [facility] Patient Information 2015 [place], [place]. This information is not intended to replace advice given to you by your health care provider. Make sure you discuss any questions you have with your health care provider. Immunization Schedule, Adult  Influenza vaccine.  All adults should be immunized every year.  All adults, including pregnant women and people with hives-only  allergy to eggs can receive the inactivated influenza (IIV) vaccine.  Adults aged 18-49 years can receive the recombinant influenza (RIV) vaccine. The RIV vaccine does not contain any egg protein.  Adults aged 67 years or older can receive the standard-dose IIV  or the high-dose IIV.  Tetanus, diphtheria, and acellular pertussis (Td, Tdap) vaccine.  Pregnant women should receive 1 dose of Tdap vaccine during each pregnancy. The dose should be obtained regardless of the length of time since the last dose. Immunization is preferred during the 27th to 36th week of gestation.  An adult who has not previously received Tdap or who does not know his or her vaccine status should receive 1 dose of Tdap. This initial dose should be followed by tetanus and diphtheria toxoids (Td) booster doses every 10 years.  Adults with an unknown or incomplete history of completing a 3-dose immunization series with Td-containing vaccines should begin or complete a primary immunization series including a Tdap dose.  Adults should receive a Td booster every 10 years.  Varicella vaccine.  An adult without evidence of immunity to varicella should receive 2 doses or a second dose if he or she has previously received 1 dose.  Pregnant females who do not have evidence of immunity should receive the first dose after pregnancy. This first dose should be obtained before leaving the health care facility. The second dose should be obtained 4-8 weeks after the first dose.  Human papillomavirus (HPV) vaccine.  Females aged 13-26 years who have not received the vaccine previously should obtain the 3-dose series.  The vaccine is not recommended for use in pregnant females. However, pregnancy testing is not needed before receiving a dose. If a male is found to be pregnant after receiving a dose, no treatment is needed. In that case, the remaining doses should be delayed until after the pregnancy.  Males aged 58-21 years who have not received the vaccine previously should receive the 3-dose series. Males aged 22-26 years may be immunized.  Immunization is recommended through the age of 29 years for any male who has sex with males and did not get any or all doses earlier.  Immunization  is recommended for any person with an immunocompromised condition through the age of 50 years if he or she did not get any or all doses earlier.  During the 3-dose series, the second dose should be obtained 4-8 weeks after the first dose. The third dose should be obtained 24 weeks after the first dose and 16 weeks after the second dose.  Zoster vaccine.  One dose is recommended for adults aged 62 years or older unless certain conditions are present.  Measles, mumps, and rubella (MMR) vaccine.  Adults born before 20 generally are considered immune to measles and mumps.  Adults born in [birth year] or later should have 1 or more doses of MMR vaccine unless there is a contraindication to the vaccine or there is laboratory evidence of immunity to each of the three diseases.  A routine second dose of MMR vaccine should be obtained at least 28 days after the first dose for students attending postsecondary schools, health care workers, or international travelers.  People who received inactivated measles vaccine or an unknown type of measles vaccine during 1963-1967 should receive 2 doses of MMR vaccine.  People who received inactivated mumps vaccine or an unknown type of mumps vaccine before 1979 and are at high risk for mumps infection should consider immunization with 2 doses of MMR vaccine.  For females of childbearing age, rubella immunity should be determined. If there is no evidence of immunity, females who are not pregnant should be vaccinated. If there is no evidence of immunity, females who are pregnant should delay immunization until after pregnancy.  Unvaccinated health care workers born before [birth year] who lack laboratory evidence of measles, mumps, or rubella immunity or laboratory confirmation of disease should consider measles and mumps immunization with 2 doses of MMR vaccine or rubella immunization with 1 dose of MMR vaccine.  Pneumococcal 13-valent conjugate (PCV13) vaccine.  When  indicated, a person who is uncertain of his or her immunization history and has no record of immunization should receive the PCV13 vaccine.  An adult aged 37 years or older who has certain medical conditions and has not been previously immunized should receive 1 dose of PCV13 vaccine. This PCV13 should be followed with a dose of pneumococcal polysaccharide (PPSV23) vaccine. The PPSV23 vaccine dose should be obtained at least 8 weeks after the dose of PCV13 vaccine.  An adult aged 64 years or older who has certain medical conditions and previously received 1 or more doses of PPSV23 vaccine should receive 1 dose of PCV13. The PCV13 vaccine dose should be obtained 1 or more years after the last PPSV23 vaccine dose.  Pneumococcal polysaccharide (PPSV23) vaccine.  When PCV13 is also indicated, PCV13 should be obtained first.  All adults aged 2 years and older should be immunized.  An adult younger than age 89 years who has certain medical conditions should be immunized.  Any person who resides in a nursing home or long-term care facility should be immunized.  An adult smoker should be immunized.  People with an immunocompromised condition and certain other conditions should receive both PCV13 and PPSV23 vaccines.  People with human immunodeficiency virus (HIV) infection should be immunized as soon as possible after diagnosis.  Immunization during chemotherapy or radiation therapy should be avoided.  Routine use of PPSV23 vaccine is not recommended for [place] Indians, [place]atives, or people younger than 65 years unless there are medical conditions that require PPSV23 vaccine.  When indicated, people who have unknown immunization and have no record of immunization should receive PPSV23 vaccine.  One-time revaccination 5 years after the first dose of PPSV23 is recommended for people aged 19-64 years who have chronic kidney failure, nephrotic syndrome, asplenia, or immunocompromised  conditions.  People who received 1-2 doses of PPSV23 before age 38 years should receive another dose of PPSV23 vaccine at age 5 years or later if at least 5 years have passed since the previous dose.  Doses of PPSV23 are not needed for people immunized with PPSV23 at or after age 65 years.  Meningococcal vaccine.  Adults with asplenia or persistent complement component deficiencies should receive 2 doses of quadrivalent meningococcal conjugate (MenACWY-D) vaccine. The doses should be obtained at least 2 months apart.  Microbiologists working with certain meningococcal bacteria, [place] recruits, people at risk during an outbreak, and people who travel to or live in countries with a high rate of meningitis should be immunized.  A first-year college student up through age 62 years who is living in a residence hall should receive a dose if he or she did not receive a dose on or after his or her 16th birthday.  Adults who have certain high-risk conditions should receive one or more doses of vaccine.  Hepatitis A vaccine.  Adults who wish to be protected from this disease, have certain high-risk conditions, work with hepatitis  A-infected animals, work in hepatitis A research labs, or travel to or work in countries with a high rate of hepatitis A should be immunized.  Adults who were previously unvaccinated and who anticipate close contact with an international adoptee during the first 60 days after arrival in the [place] from a country with a high rate of hepatitis A should be immunized.  Hepatitis B vaccine.  Adults who wish to be protected from this disease, have certain high-risk conditions, may be exposed to blood or other infectious body fluids, are household contacts or sex partners of hepatitis B positive people, are clients or workers in certain care facilities, or travel to or work in countries with a high rate of hepatitis B should be immunized.  Haemophilus influenzae type b  (Hib) vaccine.  A previously unvaccinated person with asplenia or sickle cell disease or having a scheduled splenectomy should receive 1 dose of Hib vaccine.  Regardless of previous immunization, a recipient of a hematopoietic stem cell transplant should receive a 3-dose series 6-12 months after his or her successful transplant.  Hib vaccine is not recommended for adults with HIV infection. Document Released: 07/28/2003 Document Revised: 09/01/2012 Document Reviewed: 06/24/2012 [facility] Patient Information 2015 [place], [place]. This information is not intended to replace advice given to you by your health care provider. Make sure you discuss any questions you have with your health care provider.

## 2014-01-24 LAB — IFOBT (OCCULT BLOOD): IFOBT: NEGATIVE

## 2014-01-25 LAB — PSA, MEDICARE: PSA: 2.56 ng/mL (ref <=4.00)

## 2014-06-28 ENCOUNTER — Ambulatory Visit (INDEPENDENT_AMBULATORY_CARE_PROVIDER_SITE_OTHER): Payer: BLUE CROSS/BLUE SHIELD

## 2014-06-28 ENCOUNTER — Encounter: Payer: Self-pay | Admitting: Podiatry

## 2014-06-28 ENCOUNTER — Ambulatory Visit (INDEPENDENT_AMBULATORY_CARE_PROVIDER_SITE_OTHER): Payer: Medicare Other | Admitting: Podiatry

## 2014-06-28 VITALS — BP 146/96 | HR 74 | Resp 16

## 2014-06-28 DIAGNOSIS — M79672 Pain in left foot: Secondary | ICD-10-CM

## 2014-06-28 DIAGNOSIS — M779 Enthesopathy, unspecified: Secondary | ICD-10-CM

## 2014-06-28 MED ORDER — TRIAMCINOLONE ACETONIDE 10 MG/ML IJ SUSP
10.0000 mg | Freq: Once | INTRAMUSCULAR | Status: AC
  Administered 2014-06-28: 10 mg

## 2014-06-28 NOTE — Progress Notes (Signed)
   Subjective:    Patient ID: Peter Newman, male    DOB: [birth year], 69 y.o.   MRN: [id number]  HPI Comments: "I have pain on the outside of the foot"  Patient c/o sharp, shooting pains lateral left foot for about 2 weeks. Pain is shooting up the side of his leg too. He has been taking Advil-helps some. The area is slightly red and swollen.      Review of Systems  Constitutional: Positive for unexpected weight change.  HENT: Positive for sinus pressure.   Genitourinary: Positive for frequency.  Neurological: Positive for tremors.  All other systems reviewed and are negative.      Objective:   Physical Exam        Assessment & Plan:

## 2014-06-29 NOTE — Progress Notes (Signed)
Subjective:     Patient ID: Peter Newman, male   DOB: [birth year], 69 y.o.   MRN: [id number]  HPI patient presents with a lot of pain in the outside of the left foot for several weeks. Does not remember specific injury or damage but states he's had on and off problems with this in the past but never this severe   Review of Systems  All other systems reviewed and are negative.      Objective:   Physical Exam  Constitutional: He is oriented to person, place, and time.  Cardiovascular: Intact distal pulses.   Musculoskeletal: Normal range of motion.  Neurological: He is oriented to person, place, and time.  Skin: Skin is warm.  Nursing note and vitals reviewed.  neurovascular status intact with muscle strength adequate and range of motion subtalar and midtarsal joint within normal limits. Patient's noted to have good digital perfusion is well oriented 3 and is noted to have quite a bit of discomfort at the peroneal insertion at the base of the fifth metatarsal left with no indication of tendon compromise. Patient has good muscle strength and range of motion of the metatarsal phalangeal joints     Assessment:     Probable acute tendinitis left peroneal insertion base of fifth metatarsal    Plan:     H&P and x-ray reviewed. Today I went ahead and did a careful sheath injection peroneal tendon at its insertion aseptic fifth metatarsal with 3 mg dexamethasone Kenalog 5 mg Xylocaine and applied a brace to provide for him mobilization of the ankle and reduce the stress. Reappoint for [place] to recheck in 2 weeks or earlier if any issues should occur

## 2014-07-26 ENCOUNTER — Ambulatory Visit: Payer: Medicare Other | Admitting: Podiatry

## 2014-07-30 ENCOUNTER — Ambulatory Visit (INDEPENDENT_AMBULATORY_CARE_PROVIDER_SITE_OTHER): Payer: Medicare Other | Admitting: Internal Medicine

## 2014-07-30 VITALS — BP 122/84 | HR 77 | Temp 98.1°F | Resp 16 | Ht 73.5 in | Wt 185.0 lb

## 2014-07-30 DIAGNOSIS — Z23 Encounter for immunization: Secondary | ICD-10-CM | POA: Diagnosis not present

## 2014-07-30 DIAGNOSIS — R319 Hematuria, unspecified: Secondary | ICD-10-CM

## 2014-07-30 DIAGNOSIS — M545 Low back pain, unspecified: Secondary | ICD-10-CM

## 2014-07-30 DIAGNOSIS — R739 Hyperglycemia, unspecified: Secondary | ICD-10-CM

## 2014-07-30 DIAGNOSIS — R208 Other disturbances of skin sensation: Secondary | ICD-10-CM | POA: Diagnosis not present

## 2014-07-30 DIAGNOSIS — R5383 Other fatigue: Secondary | ICD-10-CM | POA: Diagnosis not present

## 2014-07-30 LAB — POCT CBC
GRANULOCYTE PERCENT: 66.8 % (ref 37–80)
HEMATOCRIT: 46.7 % (ref 43.5–53.7)
HEMOGLOBIN: 15.7 g/dL (ref 14.1–18.1)
Lymph, poc: 1.3 (ref 0.6–3.4)
MCH: 31.4 pg — AB (ref 27–31.2)
MCHC: 33.7 g/dL (ref 31.8–35.4)
MCV: 93.2 fL (ref 80–97)
MID (cbc): 0.3 (ref 0–0.9)
MPV: 6.4 fL (ref 0–99.8)
POC Granulocyte: 3.1 (ref 2–6.9)
POC LYMPH PERCENT: 27.2 %L (ref 10–50)
POC MID %: 6 %M (ref 0–12)
Platelet Count, POC: 208 10*3/uL (ref 142–424)
RBC: 5.01 M/uL (ref 4.69–6.13)
RDW, POC: 13.3 %
WBC: 4.6 10*3/uL (ref 4.6–10.2)

## 2014-07-30 LAB — POCT UA - MICROSCOPIC ONLY
BACTERIA, U MICROSCOPIC: NEGATIVE
CASTS, UR, LPF, POC: NEGATIVE
Crystals, Ur, HPF, POC: NEGATIVE
Epithelial cells, urine per micros: NEGATIVE
Mucus, UA: NEGATIVE
RBC, urine, microscopic: NEGATIVE
Yeast, UA: NEGATIVE

## 2014-07-30 LAB — POCT URINALYSIS DIPSTICK
Bilirubin, UA: NEGATIVE
Blood, UA: NEGATIVE
Glucose, UA: NEGATIVE
Ketones, UA: NEGATIVE
LEUKOCYTES UA: NEGATIVE
NITRITE UA: NEGATIVE
PH UA: 6
PROTEIN UA: NEGATIVE
Spec Grav, UA: 1.015
UROBILINOGEN UA: 0.2

## 2014-07-30 LAB — VITAMIN B12: Vitamin B-12: 480 pg/mL (ref 211–911)

## 2014-07-30 LAB — GLUCOSE, POCT (MANUAL RESULT ENTRY): POC Glucose: 122 mg/dl — AB (ref 70–99)

## 2014-07-30 MED ORDER — ZOSTER VACCINE LIVE 19400 UNT/0.65ML ~~LOC~~ SOLR: 0.6500 mL | Freq: Once | SUBCUTANEOUS | Status: DC

## 2014-07-30 NOTE — Progress Notes (Signed)
   Subjective:    Patient ID: Peter Newman, male    DOB: 14-Nov-1945, 69 y.o.   MRN: [id number]  HPI Family hx of B12 deficiency and wants screening. Has neuropathy in feet, burning. Normal cpe 9/15   Review of Systems     Objective:   Physical Exam  Constitutional: He is oriented to person, place, and time. He appears well-developed and well-nourished.  HENT:  Head: Normocephalic.  Nose: Nose normal.  Eyes: Conjunctivae and EOM are normal. Pupils are equal, round, and reactive to light. No scleral icterus.  Neck: Normal range of motion. Neck supple. No thyromegaly present.  Cardiovascular: Normal rate, regular rhythm and normal heart sounds.   Pulmonary/Chest: Effort normal and breath sounds normal. No respiratory distress. He exhibits no tenderness.  Abdominal: Soft. He exhibits no mass. There is no tenderness.  Genitourinary: Penis normal.  Musculoskeletal: Normal range of motion. He exhibits tenderness.       Lumbar back: He exhibits tenderness and spasm. He exhibits normal range of motion, no bony tenderness, no swelling, no edema, no deformity, no pain and normal pulse.  Lymphadenopathy:    He has no cervical adenopathy.  Neurological: He is alert and oriented to person, place, and time. No cranial nerve deficit. He exhibits normal muscle tone. Coordination normal.  Psychiatric: He has a normal mood and affect.     Results for orders placed or performed in visit on 07/30/14  POCT CBC  Result Value Ref Range   WBC 4.6 4.6 - 10.2 K/uL   Lymph, poc 1.3 0.6 - 3.4   POC LYMPH PERCENT 27.2 10 - 50 %L   MID (cbc) 0.3 0 - 0.9   POC MID % 6.0 0 - 12 %M   POC Granulocyte 3.1 2 - 6.9   Granulocyte percent 66.8 37 - 80 %G   RBC 5.01 4.69 - 6.13 M/uL   Hemoglobin 15.7 14.1 - 18.1 g/dL   HCT, POC 46.7 43.5 - 53.7 %   MCV 93.2 80 - 97 fL   MCH, POC 31.4 (A) 27 - 31.2 pg   MCHC 33.7 31.8 - 35.4 g/dL   RDW, POC 13.3 %   Platelet Count, POC 208 142 - 424 K/uL   MPV 6.4 0 -  99.8 fL  POCT glucose (manual entry)  Result Value Ref Range   POC Glucose 122 (A) 70 - 99 mg/dl  POCT urinalysis dipstick  Result Value Ref Range   Color, UA yellow    Clarity, UA clear    Glucose, UA neg    Bilirubin, UA neg    Ketones, UA neg    Spec Grav, UA 1.015    Blood, UA neg    pH, UA 6.0    Protein, UA neg    Urobilinogen, UA 0.2    Nitrite, UA neg    Leukocytes, UA Negative   POCT UA - Microscopic Only  Result Value Ref Range   WBC, Ur, HPF, POC 0-1    RBC, urine, microscopic neg    Bacteria, U Microscopic neg    Mucus, UA neg    Epithelial cells, urine per micros neg    Crystals, Ur, HPF, POC neg    Casts, Ur, LPF, POC neg    Yeast, UA neg         Assessment & Plan:  LBP/Back strain Stretches and exercises
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
## 2014-07-30 NOTE — Patient Instructions (Signed)
Immunization Schedule, Adult  Influenza vaccine.  All adults should be immunized every year.  All adults, including pregnant women and people with hives-only allergy to eggs can receive the inactivated influenza (IIV) vaccine.  Adults aged 71-49 years can receive the recombinant influenza (RIV) vaccine. The RIV vaccine does not contain any egg protein.  Adults aged 76 years or older can receive the standard-dose IIV or the high-dose IIV.  Tetanus, diphtheria, and acellular pertussis (Td, Tdap) vaccine.  Pregnant women should receive 1 dose of Tdap vaccine during each pregnancy. The dose should be obtained regardless of the length of time since the last dose. Immunization is preferred during the 27th to 36th week of gestation.  An adult who has not previously received Tdap or who does not know his or her vaccine status should receive 1 dose of Tdap. This initial dose should be followed by tetanus and diphtheria toxoids (Td) booster doses every 10 years.  Adults with an unknown or incomplete history of completing a 3-dose immunization series with Td-containing vaccines should begin or complete a primary immunization series including a Tdap dose.  Adults should receive a Td booster every 10 years.  Varicella vaccine.  An adult without evidence of immunity to varicella should receive 2 doses or a second dose if he or she has previously received 1 dose.  Pregnant females who do not have evidence of immunity should receive the first dose after pregnancy. This first dose should be obtained before leaving the health care facility. The second dose should be obtained 4-8 weeks after the first dose.  Human papillomavirus (HPV) vaccine.  Females aged 13-26 years who have not received the vaccine previously should obtain the 3-dose series.  The vaccine is not recommended for use in pregnant females. However, pregnancy testing is not needed before receiving a dose. If a male is found to be  pregnant after receiving a dose, no treatment is needed. In that case, the remaining doses should be delayed until after the pregnancy.  Males aged 37-21 years who have not received the vaccine previously should receive the 3-dose series. Males aged 22-26 years may be immunized.  Immunization is recommended through the age of 93 years for any male who has sex with males and did not get any or all doses earlier.  Immunization is recommended for any person with an immunocompromised condition through the age of 71 years if he or she did not get any or all doses earlier.  During the 3-dose series, the second dose should be obtained 4-8 weeks after the first dose. The third dose should be obtained 24 weeks after the first dose and 16 weeks after the second dose.  Zoster vaccine.  One dose is recommended for adults aged 73 years or older unless certain conditions are present.  Measles, mumps, and rubella (MMR) vaccine.  Adults born before 35 generally are considered immune to measles and mumps.  Adults born in [birth year] or later should have 1 or more doses of MMR vaccine unless there is a contraindication to the vaccine or there is laboratory evidence of immunity to each of the three diseases.  A routine second dose of MMR vaccine should be obtained at least 28 days after the first dose for students attending postsecondary schools, health care workers, or international travelers.  People who received inactivated measles vaccine or an unknown type of measles vaccine during 1963-1967 should receive 2 doses of MMR vaccine.  People who received inactivated mumps vaccine or an unknown type  of mumps vaccine before 1979 and are at high risk for mumps infection should consider immunization with 2 doses of MMR vaccine.  For females of childbearing age, rubella immunity should be determined. If there is no evidence of immunity, females who are not pregnant should be vaccinated. If there is no evidence of  immunity, females who are pregnant should delay immunization until after pregnancy.  Unvaccinated health care workers born before 1957 who lack laboratory evidence of measles, mumps, or rubella immunity or laboratory confirmation of disease should consider measles and mumps immunization with 2 doses of MMR vaccine or rubella immunization with 1 dose of MMR vaccine.  Pneumococcal 13-valent conjugate (PCV13) vaccine.  When indicated, a person who is uncertain of his or her immunization history and has no record of immunization should receive the PCV13 vaccine.  An adult aged 19 years or older who has certain medical conditions and has not been previously immunized should receive 1 dose of PCV13 vaccine. This PCV13 should be followed with a dose of pneumococcal polysaccharide (PPSV23) vaccine. The PPSV23 vaccine dose should be obtained at least 8 weeks after the dose of PCV13 vaccine.  An adult aged 19 years or older who has certain medical conditions and previously received 1 or more doses of PPSV23 vaccine should receive 1 dose of PCV13. The PCV13 vaccine dose should be obtained 1 or more years after the last PPSV23 vaccine dose.  Pneumococcal polysaccharide (PPSV23) vaccine.  When PCV13 is also indicated, PCV13 should be obtained first.  All adults aged 65 years and older should be immunized.  An adult younger than age 65 years who has certain medical conditions should be immunized.  Any person who resides in a nursing home or long-term care facility should be immunized.  An adult smoker should be immunized.  People with an immunocompromised condition and certain other conditions should receive both PCV13 and PPSV23 vaccines.  People with human immunodeficiency virus (HIV) infection should be immunized as soon as possible after diagnosis.  Immunization during chemotherapy or radiation therapy should be avoided.  Routine use of PPSV23 vaccine is not recommended for American Indians,  [place] Natives, or people younger than 65 years unless there are medical conditions that require PPSV23 vaccine.  When indicated, people who have unknown immunization and have no record of immunization should receive PPSV23 vaccine.  One-time revaccination 5 years after the first dose of PPSV23 is recommended for people aged 19-64 years who have chronic kidney failure, nephrotic syndrome, asplenia, or immunocompromised conditions.  People who received 1-2 doses of PPSV23 before age 65 years should receive another dose of PPSV23 vaccine at age 65 years or later if at least 5 years have passed since the previous dose.  Doses of PPSV23 are not needed for people immunized with PPSV23 at or after age 65 years.  Meningococcal vaccine.  Adults with asplenia or persistent complement component deficiencies should receive 2 doses of quadrivalent meningococcal conjugate (MenACWY-D) vaccine. The doses should be obtained at least 2 months apart.  Microbiologists working with certain meningococcal bacteria, military recruits, people at risk during an outbreak, and people who travel to or live in countries with a high rate of meningitis should be immunized.  A first-year college student up through age 21 years who is living in a residence hall should receive a dose if he or she did not receive a dose on or after his or her 16th birthday.  Adults who have certain high-risk conditions should receive one or more doses   of vaccine.  Hepatitis A vaccine.  Adults who wish to be protected from this disease, have certain high-risk conditions, work with hepatitis A-infected animals, work in hepatitis A research labs, or travel to or work in countries with a high rate of hepatitis A should be immunized.  Adults who were previously unvaccinated and who anticipate close contact with an international adoptee during the first 60 days after arrival in the [place] from a country with a high rate of hepatitis A should  be immunized.  Hepatitis B vaccine.  Adults who wish to be protected from this disease, have certain high-risk conditions, may be exposed to blood or other infectious body fluids, are household contacts or sex partners of hepatitis B positive people, are clients or workers in certain care facilities, or travel to or work in countries with a high rate of hepatitis B should be immunized.  Haemophilus influenzae type b (Hib) vaccine.  A previously unvaccinated person with asplenia or sickle cell disease or having a scheduled splenectomy should receive 1 dose of Hib vaccine.  Regardless of previous immunization, a recipient of a hematopoietic stem cell transplant should receive a 3-dose series 6-12 months after his or her successful transplant.  Hib vaccine is not recommended for adults with HIV infection. Document Released: 07/28/2003 Document Revised: 09/01/2012 Document Reviewed: 06/24/2012 Exit[employer] Patient Information 2015 [employer]. This information is not intended to replace advice given to you by your health care provider. Make sure you discuss any questions you have with your health care provider.  

## 2014-08-16 ENCOUNTER — Ambulatory Visit (INDEPENDENT_AMBULATORY_CARE_PROVIDER_SITE_OTHER): Payer: Medicare Other | Admitting: Podiatry

## 2014-08-16 VITALS — BP 115/71 | HR 78 | Resp 16

## 2014-08-16 DIAGNOSIS — M779 Enthesopathy, unspecified: Secondary | ICD-10-CM

## 2014-08-16 NOTE — Progress Notes (Signed)
Subjective:     Patient ID: Peter Newman, male   DOB: December 25, 1945, 69 y.o.   MRN: [id number]  HPI patient states I'm having severe pain on the side of my left foot that started recently and is making it impossible for me to walk or ambulate he had had this several months ago which had improved and then in the last few days it has gotten increasingly more painful and making it increasingly difficult for him to ambulate   Review of Systems     Objective:   Physical Exam Neurovascular status intact muscle strength was found to be adequate of the extensors flexors but I did note moderate diminishment of strength of the peroneal tendon left with severe discomfort at the peroneal brevis insertion into the base of the fifth metatarsal with inflammation and fluid around the insertional point    Assessment:     Possible tear of the peroneal tendon left versus inflammatory tendinitis with inability to weight-bear or ambulate    Plan:     Reviewed condition and discussed possibility for MRI of this. At this time we are placing him in an air fracture walker for complete immobilization for the next 3 weeks and he will then be seen out a weighted to decide whether or not MRI is necessary

## 2014-09-13 ENCOUNTER — Ambulatory Visit: Payer: Medicare Other | Admitting: Podiatry

## 2014-11-04 ENCOUNTER — Encounter: Payer: Self-pay | Admitting: Gastroenterology

## 2014-12-20 DIAGNOSIS — R05 Cough: Secondary | ICD-10-CM | POA: Diagnosis not present

## 2014-12-27 DIAGNOSIS — R05 Cough: Secondary | ICD-10-CM | POA: Diagnosis not present

## 2014-12-31 ENCOUNTER — Ambulatory Visit: Payer: Medicare Other

## 2014-12-31 VITALS — Ht 73.5 in | Wt 187.0 lb

## 2014-12-31 DIAGNOSIS — Z1211 Encounter for screening for malignant neoplasm of colon: Secondary | ICD-10-CM

## 2014-12-31 MED ORDER — MOVIPREP 100 G PO SOLR: 1.0000 | Freq: Once | ORAL | Status: DC

## 2014-12-31 NOTE — Progress Notes (Signed)
No allergies to eggs or soy No diet/weight loss meds No diet/weight loss meds No home oxygen No past problems with anesthesia except bladder retention after cholecystectomy  Has email  Emmi instructions given for colonoscopy

## 2015-01-11 ENCOUNTER — Encounter: Payer: Self-pay | Admitting: Gastroenterology

## 2015-01-11 ENCOUNTER — Ambulatory Visit (AMBULATORY_SURGERY_CENTER): Payer: Medicare Other | Admitting: Gastroenterology

## 2015-01-11 VITALS — BP 102/62 | HR 78 | Temp 96.8°F | Resp 28 | Ht 73.5 in | Wt 187.0 lb

## 2015-01-11 DIAGNOSIS — D122 Benign neoplasm of ascending colon: Secondary | ICD-10-CM | POA: Diagnosis not present

## 2015-01-11 DIAGNOSIS — D123 Benign neoplasm of transverse colon: Secondary | ICD-10-CM | POA: Diagnosis not present

## 2015-01-11 DIAGNOSIS — D128 Benign neoplasm of rectum: Secondary | ICD-10-CM | POA: Diagnosis not present

## 2015-01-11 DIAGNOSIS — Z1211 Encounter for screening for malignant neoplasm of colon: Secondary | ICD-10-CM

## 2015-01-11 DIAGNOSIS — D125 Benign neoplasm of sigmoid colon: Secondary | ICD-10-CM

## 2015-01-11 DIAGNOSIS — D129 Benign neoplasm of anus and anal canal: Secondary | ICD-10-CM

## 2015-01-11 MED ORDER — SODIUM CHLORIDE 0.9 % IV SOLN: 500.0000 mL | INTRAVENOUS | Status: DC

## 2015-01-11 NOTE — Progress Notes (Signed)
Called to room to assist during endoscopic procedure.  Patient ID and intended procedure confirmed with present staff. Received instructions for my participation in the procedure from the performing physician.  

## 2015-01-11 NOTE — Op Note (Signed)
[place]  [address] [place], [id number]   COLONOSCOPY PROCEDURE REPORT PATIENT: Peter Newman, Peter Newman  MR#: [id number] BIRTHDATE: December 25, 1945 , 26  yrs. old GENDER: male ENDOSCOPIST: Milus Banister, MD PROCEDURE DATE:  01/11/2015 PROCEDURE:   Colonoscopy, screening, Colonoscopy with biopsy, and Colonoscopy with snare polypectomy First Screening Colonoscopy - Avg.  risk and is 50 yrs.  old or older - No.  Prior Negative Screening - Now for repeat screening. 10 or more years since last screening  History of Adenoma - Now for follow-up colonoscopy & has been > or = to 3 yrs.  N/A  Polyps removed today? Yes ASA CLASS:   Class II INDICATIONS:Screening for colonic neoplasia, Colorectal Neoplasm Risk Assessment for this procedure is average risk, and Colonsocopy 2005 Dr.  Sharlett Iles, no polyps. MEDICATIONS: Monitored anesthesia care and Propofol 300 mg IV  DESCRIPTION OF PROCEDURE:   After the risks benefits and alternatives of the procedure were thoroughly explained, informed consent was obtained.  The digital rectal exam revealed no abnormalities of the rectum.   The LB [id number]  endoscope was introduced through the anus and advanced to the cecum, which was identified by both the appendix and ileocecal valve. No adverse events experienced.   The quality of the prep was excellent.  The instrument was then slowly withdrawn as the colon was fully examined. Estimated blood loss is zero unless otherwise noted in this procedure report.  COLON FINDINGS: A sessile polyp measuring 2 mm in size was found in the ascending colon.  A polypectomy was performed with cold forceps.  The resection was complete, the polyp tissue was completely retrieved and sent to histology.   Three sessile polyps ranging between 3-54mm in size were found in the rectum, sigmoid colon, and transverse colon.  Polypectomies were performed with a cold snare.  The resection was complete, the  polyp tissue was completely retrieved and sent to histology.   The examination was otherwise normal.  Retroflexed views revealed no abnormalities. The time to cecum = 1.3 Withdrawal time = 13.5   The scope was withdrawn and the procedure completed. COMPLICATIONS: There were no immediate complications. ENDOSCOPIC IMPRESSION: 1.   Sessile polyp was found in the ascending colon; polypectomy was performed with cold forceps 2.  Three sessile polyps ranging between 3-75mm in size were found in the rectum, sigmoid colon, and transverse colon; polypectomies were performed with a cold snare 3.   The examination was otherwise normal RECOMMENDATIONS: If the polyp(s) removed today are proven to be adenomatous (pre-cancerous) polyps, you will need a colonoscopy in 3-5 years. Otherwise you should continue to follow colorectal cancer screening guidelines for "routine risk" patients with a colonoscopy in 10 years.  You will receive a letter within 1-2 weeks with the results of your biopsy as well as final recommendations.  Please call my office if you have not received a letter after 3 weeks.  eSigned:  Milus Banister, MD 01/11/2015 8:47 AM   cc: Lou Miner, MD

## 2015-01-11 NOTE — Patient Instructions (Signed)
YOU HAD AN ENDOSCOPIC PROCEDURE TODAY AT [facility]:   Refer to the procedure report that was given to you for any specific questions about what was found during the examination.  If the procedure report does not answer your questions, please call your gastroenterologist to clarify.  If you requested that your care partner not be given the details of your procedure findings, then the procedure report has been included in a sealed envelope for you to review at your convenience later.  YOU SHOULD EXPECT: Some feelings of bloating in the abdomen. Passage of more gas than usual.  Walking can help get rid of the air that was put into your GI tract during the procedure and reduce the bloating. If you had a lower endoscopy (such as a colonoscopy or flexible sigmoidoscopy) you may notice spotting of blood in your stool or on the toilet paper. If you underwent a bowel prep for your procedure, you may not have a normal bowel movement for a few days.  Please Note:  You might notice some irritation and congestion in your nose or some drainage.  This is from the oxygen used during your procedure.  There is no need for concern and it should clear up in a day or so.  SYMPTOMS TO REPORT IMMEDIATELY:   Following lower endoscopy (colonoscopy or flexible sigmoidoscopy):  Excessive amounts of blood in the stool  Significant tenderness or worsening of abdominal pains  Swelling of the abdomen that is new, acute  Fever of 100F or higher   For urgent or emergent issues, a gastroenterologist can be reached at any hour by calling [phone].   DIET: Your first meal following the procedure should be a small meal and then it is ok to progress to your normal diet. Heavy or fried foods are harder to digest and may make you feel nauseous or bloated.  Likewise, meals heavy in dairy and vegetables can increase bloating.  Drink plenty of fluids but you should avoid alcoholic beverages for 24  hours.  ACTIVITY:  You should plan to take it easy for the rest of today and you should NOT DRIVE or use heavy machinery until tomorrow (because of the sedation medicines used during the test).    FOLLOW UP: Our staff will call the number listed on your records the next business day following your procedure to check on you and address any questions or concerns that you may have regarding the information given to you following your procedure. If we do not reach you, we will leave a message.  However, if you are feeling well and you are not experiencing any problems, there is no need to return our call.  We will assume that you have returned to your regular daily activities without incident.  If any biopsies were taken you will be contacted by phone or by letter within the next 1-3 weeks.  Please call us at [phone] if you have not heard about the biopsies in 3 weeks.    SIGNATURES/CONFIDENTIALITY: You and/or your care partner have signed paperwork which will be entered into your electronic medical record.  These signatures attest to the fact that that the information above on your After Visit Summary has been reviewed and is understood.  Full responsibility of the confidentiality of this discharge information lies with you and/or your care-partner.  Thank-you for choosing [facility] for your healthcare needs.  Read all of the handouts given to you by your recovery room nurse.

## 2015-01-11 NOTE — Progress Notes (Signed)
Transferred to recovery room. A/O x3, pleased with MAC.  VSS.  Report to Suzanne, RN. 

## 2015-01-12 ENCOUNTER — Telehealth: Payer: Self-pay

## 2015-01-12 NOTE — Telephone Encounter (Signed)
  Follow up Call-  Call back number 01/11/2015  Post procedure Call Back phone  # [phone]  Permission to leave phone message Yes     Patient questions:  Do you have a fever, pain , or abdominal swelling? No. Pain Score  0 *  Have you tolerated food without any problems? Yes.    Have you been able to return to your normal activities? Yes.    Do you have any questions about your discharge instructions: Diet   No. Medications  No. Follow up visit  No.  Do you have questions or concerns about your Care? No.  Actions: * If pain score is 4 or above: No action needed, pain <4.

## 2015-01-25 DIAGNOSIS — Z23 Encounter for immunization: Secondary | ICD-10-CM | POA: Diagnosis not present

## 2015-01-26 ENCOUNTER — Encounter: Payer: Self-pay | Admitting: Gastroenterology

## 2015-04-21 ENCOUNTER — Ambulatory Visit (INDEPENDENT_AMBULATORY_CARE_PROVIDER_SITE_OTHER): Payer: Medicare Other | Admitting: Internal Medicine

## 2015-04-21 ENCOUNTER — Encounter: Payer: Self-pay | Admitting: Internal Medicine

## 2015-04-21 ENCOUNTER — Other Ambulatory Visit (INDEPENDENT_AMBULATORY_CARE_PROVIDER_SITE_OTHER): Payer: Medicare Other

## 2015-04-21 VITALS — BP 110/72 | HR 96 | Ht 75.0 in | Wt 201.2 lb

## 2015-04-21 DIAGNOSIS — J0101 Acute recurrent maxillary sinusitis: Secondary | ICD-10-CM

## 2015-04-21 DIAGNOSIS — J309 Allergic rhinitis, unspecified: Secondary | ICD-10-CM

## 2015-04-21 DIAGNOSIS — J3089 Other allergic rhinitis: Secondary | ICD-10-CM

## 2015-04-21 DIAGNOSIS — J302 Other seasonal allergic rhinitis: Secondary | ICD-10-CM

## 2015-04-21 LAB — CBC WITH DIFFERENTIAL/PLATELET
BASOS PCT: 0.4 % (ref 0.0–3.0)
Basophils Absolute: 0 10*3/uL (ref 0.0–0.1)
Eosinophils Absolute: 0 10*3/uL (ref 0.0–0.7)
Eosinophils Relative: 0.8 % (ref 0.0–5.0)
HEMATOCRIT: 43.8 % (ref 39.0–52.0)
HEMOGLOBIN: 14.9 g/dL (ref 13.0–17.0)
Lymphocytes Relative: 23.6 % (ref 12.0–46.0)
Lymphs Abs: 1.3 10*3/uL (ref 0.7–4.0)
MCHC: 33.9 g/dL (ref 30.0–36.0)
MCV: 93 fl (ref 78.0–100.0)
MONO ABS: 0.5 10*3/uL (ref 0.1–1.0)
Monocytes Relative: 9.4 % (ref 3.0–12.0)
Neutro Abs: 3.7 10*3/uL (ref 1.4–7.7)
Neutrophils Relative %: 65.8 % (ref 43.0–77.0)
Platelets: 204 10*3/uL (ref 150.0–400.0)
RBC: 4.71 Mil/uL (ref 4.22–5.81)
RDW: 13.5 % (ref 11.5–15.5)
WBC: 5.6 10*3/uL (ref 4.0–10.5)

## 2015-04-21 NOTE — Patient Instructions (Signed)
Order- lab- Allergy Profile, CBC w diff  Dx allergic rhinitis

## 2015-04-21 NOTE — Progress Notes (Signed)
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 Pt states he uses Breathe Right strips to sleep at night-states left nostril is the concern. He [place] in [place]. He rarely goes far inland in [place]. When he comes to visit [place] he gets marked increased nasal congestion and spring and fall. No history of asthma, skin allergy or food allergy. 2 or 3 times a year he gets a sinus infection with postnasal drainage and rhinitis. Z-Pak usually works well. He uses saline nasal rinse.  Breathe Right strips help with nasal septal deviation and stuffiness associated with lying on his left side. He considers himself sensitive to medications and wants to avoid if possible, preferring just nasal saline. ENT surgery limited to tonsillectomy. Smoked remotely, in his 21s. Describes white mucoid postnasal drainage.   Prior to Admission medications   Medication Sig Start Date End Date Taking? Authorizing Provider  aspirin 81 MG tablet Take 81 mg by mouth daily. Pt states he takes everyday except when he has sinus issues   Yes Historical Provider, MD  Cyanocobalamin (VITAMIN B 12 PO) Take by mouth.   Yes Historical Provider, MD  ibuprofen (ADVIL,MOTRIN) 200 MG tablet Take 200 mg by mouth every 6 (six) hours as needed.   Yes Historical Provider, MD  L-Methylfolate-Algae-B12-B6 Glade Stanford) 3-90.314-2-35 MG CAPS Take 1 capsule by mouth continuous as needed. 02/14/12  Yes Darlyne Russian, MD  Multiple Vitamin (MULTIVITAMIN) tablet Take 1 tablet by mouth daily.   Yes Historical Provider, MD   Past Medical History  Diagnosis Date  . Anxiety   . Allergy    .lsh Family History  Problem Relation Age of Onset  . Lung disease Father   . Heart disease Brother   . Heart disease Daughter   . Colon cancer Neg Hx   . Colon polyps Neg Hx    Social History   Social History  . Marital Status: Married    Spouse Name: N/A  . Number of  Children: N/A  . Years of Education: N/A   Occupational History  . retired    Social History Main Topics  . Smoking status: Former Smoker -- 0.50 packs/day for 8 years    Types: Cigarettes    Start date: 05/21/1964    Quit date: 05/21/1972  . Smokeless tobacco: Never Used  . Alcohol Use: 0.0 oz/week    0 Standard drinks or equivalent per week  . Drug Use: No  . Sexual Activity: Yes   Other Topics Concern  . Not on file   Social History Narrative   ROS-see HPI   Negative unless "+" Constitutional:    weight loss, night sweats, fevers, chills, fatigue, lassitude. HEENT:    headaches, difficulty swallowing, tooth/dental problems, sore throat,       sneezing, itching, ear ache, + nasal congestion, + post nasal drip, snoring CV:    chest pain, orthopnea, PND, swelling in lower extremities, anasarca,                                                  dizziness, palpitations Resp:   shortness of breath with exertion or at rest.                productive cough,   non-productive cough, coughing up of blood.  change in color of mucus.  wheezing.   Skin:    rash or lesions. GI:  No-   heartburn, indigestion, abdominal pain, nausea, vomiting, diarrhea,                 change in bowel habits, loss of appetite GU: dysuria, change in color of urine, no urgency or frequency.   flank pain. MS:   joint pain, stiffness, decreased range of motion, back pain. Neuro-     nothing unusual Psych:  change in mood or affect.  depression or anxiety.   memory loss.  OBJ- Physical Exam General- Alert, Oriented, Affect-appropriate, Distress- none acute, + tall  Skin- rash-none, lesions- none, excoriation- none Lymphadenopathy- none Head- atraumatic            Eyes- Gross vision intact, PERRLA, conjunctivae and secretions clear            Ears- Hearing, canals-normal            Nose- + turbinate edema,+-Septal dev narrow on left, mucus, no- polyps, erosion, perforation             Throat-  Mallampati II-III , mucosa clear , drainage- none, tonsils- atrophic Neck- flexible , trachea midline, no stridor , thyroid nl, carotid no bruit Chest - symmetrical excursion , unlabored           Heart/CV- RRR , no murmur , no gallop  , no rub, nl s1 s2                           - JVD- none , edema- none, stasis changes- none, varices- none           Lung- clear to P&A, wheeze- none, cough- none , dullness-none, rub- none           Chest wall-  Abd-  Br/ Gen/ Rectal- Not done, not indicated Extrem- cyanosis- none, clubbing, none, atrophy- none, strength- nl Neuro- grossly intact to observation

## 2015-04-22 DIAGNOSIS — J302 Other seasonal allergic rhinitis: Secondary | ICD-10-CM | POA: Insufficient documentation

## 2015-04-22 DIAGNOSIS — J0101 Acute recurrent maxillary sinusitis: Secondary | ICD-10-CM | POA: Insufficient documentation

## 2015-04-22 DIAGNOSIS — J3089 Other allergic rhinitis: Secondary | ICD-10-CM

## 2015-04-22 LAB — ALLERGY FULL PROFILE
Allergen, D pternoyssinus,d7: <0.1 kU/L
Allergen,Goose feathers, e70: <0.1 kU/L
Aspergillus fumigatus, m3: <0.1 kU/L
Candida Albicans: <0.1 kU/L
Common Ragweed: <0.1 kU/L
D. farinae: <0.1 kU/L
Elm IgE: <0.1 kU/L
G005 Rye, Perennial: <0.1 kU/L
G009 Red Top: <0.1 kU/L
Goldenrod: <0.1 kU/L
Helminthosporium halodes: <0.1 kU/L
House Dust Hollister: <0.1 kU/L
IgE (Immunoglobulin E), Serum: 5 kU/L (ref <115)
Lamb's Quarters: <0.1 kU/L
Plantain: <0.1 kU/L
Stemphylium Botryosum: <0.1 kU/L
Sycamore Tree: <0.1 kU/L
Timothy Grass: <0.1 kU/L

## 2015-04-22 NOTE — Assessment & Plan Note (Signed)
He describes 2 or 3 acute sinus infections annually, associated with seasonal pollen exposure or virus colds. Usually a Z-Pak and sinus rinse are sufficient. He is reluctant to add other medications. His septal deviation is probably an important contributor and he may benefit from ENT evaluation for possibility of surgical correction if he is willing.

## 2015-04-22 NOTE — Assessment & Plan Note (Signed)
He describes nasal congestion and increased nasal discharge on visiting [place], associated with spring and fall pollen seasons. This would be consistent with at least seasonal allergic rhinitis. Not sure what environmental factor would be different in [place] but in [place] he stays close to [employer] where [employer] will keep a lot of mainland pollens away. He is resistant to using much medication but may benefit from a nasal steroid spray and OTC antihistamine when needed. Plan-allergy IgE profiles-environmental

## 2015-04-26 ENCOUNTER — Telehealth: Payer: Self-pay | Admitting: Internal Medicine

## 2015-04-26 NOTE — Telephone Encounter (Signed)
Notes Recorded by Glean Hess, CMA on 04/26/2015 at 10:27 AM Attempted to contact patient, voicemail not set up, unable to leave message, will call back. Notes Recorded by Deneise Lever, MD on 04/26/2015 at 9:05 AM CBC blood count and the blood test for environmental allergy were both completely normal ----------- Pt is aware of results. Nothing further was needed.

## 2015-04-28 DIAGNOSIS — Z23 Encounter for immunization: Secondary | ICD-10-CM | POA: Diagnosis not present

## 2015-04-28 DIAGNOSIS — L821 Other seborrheic keratosis: Secondary | ICD-10-CM | POA: Diagnosis not present

## 2015-04-28 DIAGNOSIS — A630 Anogenital (venereal) warts: Secondary | ICD-10-CM | POA: Diagnosis not present

## 2015-05-10 ENCOUNTER — Telehealth: Payer: Self-pay | Admitting: Family Medicine

## 2015-05-10 NOTE — Telephone Encounter (Signed)
Unable to reach patient at this time  Will send an unable to reach letter to schedule annual exam.

## 2015-06-05 ENCOUNTER — Ambulatory Visit (INDEPENDENT_AMBULATORY_CARE_PROVIDER_SITE_OTHER): Payer: Medicare Other | Admitting: Family Medicine

## 2015-06-05 VITALS — BP 132/80 | HR 74 | Temp 97.4°F | Resp 18 | Ht 73.0 in | Wt 196.8 lb

## 2015-06-05 DIAGNOSIS — R972 Elevated prostate specific antigen [PSA]: Secondary | ICD-10-CM | POA: Diagnosis not present

## 2015-06-05 DIAGNOSIS — R251 Tremor, unspecified: Secondary | ICD-10-CM

## 2015-06-05 DIAGNOSIS — E785 Hyperlipidemia, unspecified: Secondary | ICD-10-CM

## 2015-06-05 LAB — COMPLETE METABOLIC PANEL WITH GFR
ALT: 26 U/L (ref 9–46)
AST: 23 U/L (ref 10–35)
Albumin: 3.8 g/dL (ref 3.6–5.1)
Alkaline Phosphatase: 61 U/L (ref 40–115)
BUN: 13 mg/dL (ref 7–25)
CO2: 31 mmol/L (ref 20–31)
Calcium: 9.1 mg/dL (ref 8.6–10.3)
Chloride: 102 mmol/L (ref 98–110)
Creat: 1.07 mg/dL (ref 0.70–1.18)
GFR, Est African American: 80 mL/min (ref >=60)
GFR, Est Non African American: 69 mL/min (ref >=60)
Glucose, Bld: 98 mg/dL (ref 65–99)
Potassium: 4.5 mmol/L (ref 3.5–5.3)
Sodium: 137 mmol/L (ref 135–146)
Total Bilirubin: 0.6 mg/dL (ref 0.2–1.2)
Total Protein: 6.7 g/dL (ref 6.1–8.1)

## 2015-06-05 LAB — POCT CBC
Granulocyte percent: 67.2 %G (ref 37–80)
HCT, POC: 47.7 % (ref 43.5–53.7)
Hemoglobin: 16.6 g/dL (ref 14.1–18.1)
Lymph, poc: 1.2 (ref 0.6–3.4)
MCH, POC: 32.2 pg — AB (ref 27–31.2)
MCHC: 34.8 g/dL (ref 31.8–35.4)
MCV: 92.3 fL (ref 80–97)
MID (cbc): 0.3 (ref 0–0.9)
MPV: 7.1 fL (ref 0–99.8)
POC Granulocyte: 3.1 (ref 2–6.9)
POC LYMPH PERCENT: 25.6 %L (ref 10–50)
POC MID %: 7.2 %M (ref 0–12)
Platelet Count, POC: 203 10*3/uL (ref 142–424)
RBC: 5.17 M/uL (ref 4.69–6.13)
RDW, POC: 13.4 %
WBC: 4.6 10*3/uL (ref 4.6–10.2)

## 2015-06-05 LAB — POCT URINALYSIS DIP (MANUAL ENTRY)
Bilirubin, UA: NEGATIVE
Blood, UA: NEGATIVE
Glucose, UA: NEGATIVE
Ketones, POC UA: NEGATIVE
Leukocytes, UA: NEGATIVE
Nitrite, UA: NEGATIVE
Protein Ur, POC: NEGATIVE
Spec Grav, UA: 1.02
Urobilinogen, UA: 0.2
pH, UA: 7

## 2015-06-05 LAB — THYROID PANEL WITH TSH
Free Thyroxine Index: 2.6 (ref 1.4–3.8)
T3 Uptake: 31 % (ref 22–35)
T4, Total: 8.5 ug/dL (ref 4.5–12.0)
TSH: 1.602 u[IU]/mL (ref 0.350–4.500)

## 2015-06-05 LAB — LIPID PANEL
Cholesterol: 194 mg/dL (ref 125–200)
HDL: 53 mg/dL (ref >=40)
LDL Cholesterol: 102 mg/dL (ref <130)
Total CHOL/HDL Ratio: 3.7 Ratio (ref <=5.0)
Triglycerides: 194 mg/dL — ABNORMAL HIGH (ref <150)
VLDL: 39 mg/dL — ABNORMAL HIGH (ref <30)

## 2015-06-05 NOTE — Progress Notes (Signed)
This chart was scribed for Robyn Haber, MD by [facility], medical scribe at [facility].The patient was seen in exam room 08 and the patient's care was started at 8:34 AM.  Patient ID: Peter Newman MRN: [id number], DOB: [birth year], 70 y.o. Date of Encounter: 06/05/2015  Primary Physician: Kennon Portela, MD  Chief Complaint:  Chief Complaint  Patient presents with  . Labs Only    fasting   HPI:  Peter Newman is a 70 y.o. male who presents to [facility] for general fasting lab work, he also request a PSA. A realtor.  Past Medical History  Diagnosis Date  . Anxiety   . Allergy     Home Meds: Prior to Admission medications   Medication Sig Start Date End Date Taking? Authorizing Provider  aspirin 81 MG tablet Take 81 mg by mouth daily. Pt states he takes everyday except when he has sinus issues   Yes Historical Provider, MD  Cyanocobalamin (VITAMIN B 12 PO) Take by mouth.   Yes Historical Provider, MD  L-Methylfolate-Algae-B12-B6 Glade Stanford) 3-90.314-2-35 MG CAPS Take 1 capsule by mouth continuous as needed. 02/14/12  Yes Darlyne Russian, MD  Multiple Vitamin (MULTIVITAMIN) tablet Take 1 tablet by mouth daily.   Yes Historical Provider, MD  ibuprofen (ADVIL,MOTRIN) 200 MG tablet Take 200 mg by mouth every 6 (six) hours as needed. Reported on 06/05/2015    Historical Provider, MD   Allergies:  Allergies  Allergen Reactions  . Nsaids Other (See Comments)    constipation   Social History   Social History  . Marital Status: Married    Spouse Name: N/A  . Number of Children: N/A  . Years of Education: N/A   Occupational History  . retired    Social History Main Topics  . Smoking status: Former Smoker -- 0.50 packs/day for 8 years    Types: Cigarettes    Start date: 05/21/1964    Quit date: 05/21/1972  . Smokeless tobacco: Never Used  . Alcohol Use: 0.0 oz/week    0 Standard drinks or equivalent per week  . Drug  Use: No  . Sexual Activity: Yes   Other Topics Concern  . Not on file   Social History Narrative    Review of Systems: Constitutional: negative for chills, fever, night sweats, weight changes, or fatigue  HEENT: negative for vision changes, hearing loss, congestion, rhinorrhea, ST, epistaxis, or sinus pressure Cardiovascular: negative for chest pain or palpitations Respiratory: negative for hemoptysis, wheezing, shortness of breath, or cough Abdominal: negative for abdominal pain, nausea, vomiting, diarrhea, or constipation Dermatological: negative for rash Neurologic: negative for headache, dizziness, or syncope All other systems reviewed and are otherwise negative with the exception to those above and in the HPI.  Physical Exam: Blood pressure 132/80, pulse 74, temperature 97.4 F (36.3 C), temperature source Oral, resp. rate 18, height 6\' 1"  (1.854 m), weight 196 lb 12.8 oz (89.268 kg), SpO2 98 %., Body mass index is 25.97 kg/(m^2). General: Well developed, well nourished, in no acute distress. Head: Normocephalic, atraumatic, eyes without discharge, sclera non-icteric, nares are without discharge. Bilateral auditory canals clear, TM's are without perforation, pearly grey and translucent with reflective cone of light bilaterally. Oral cavity moist, posterior pharynx without exudate, erythema, peritonsillar abscess, or post nasal drip.  Neck: Supple. No thyromegaly. Full ROM. No lymphadenopathy. Lungs: Clear bilaterally to auscultation without wheezes, rales, or rhonchi. Breathing is unlabored. Heart: RRR with S1 S2.  No murmurs, rubs, or gallops appreciated. Abdomen: Soft, non-tender, non-distended with normoactive bowel sounds. No hepatomegaly. No rebound/guarding. No obvious abdominal masses. Msk:  Strength and tone normal for age. Extremities/Skin: Warm and dry. No clubbing or cyanosis. No edema. No rashes or suspicious lesions. Neuro: Alert and oriented X 3. Moves all extremities  spontaneously. Gait is normal. CNII-XII grossly in tact.  Very fine intention tremor Psych:  Responds to questions appropriately with a normal affect.    ASSESSMENT AND PLAN:  70 y.o. year old male with   By signing my name below, I, Nadim Abuhashem, attest that this documentation has been prepared under the direction and in the presence of Robyn Haber, MD.  Electronically Signed: Lora Havens, medical scribe. 06/05/2015 8:40 AM.    This chart was scribed in my presence and reviewed by me personally.    ICD-9-CM ICD-10-CM   1. Tremor 781.0 R25.1 POCT CBC     POCT urinalysis dipstick     COMPLETE METABOLIC PANEL WITH GFR     Lipid panel     Thyroid Panel With TSH  2. Hyperlipidemia 272.4 E78.5 Lipid panel     Thyroid Panel With TSH  3. Elevated PSA 790.93 R97.20 PSA     Signed, Robyn Haber, MD

## 2015-06-07 LAB — PSA: PSA: 3.17 ng/mL (ref <=4.00)

## 2015-07-05 ENCOUNTER — Encounter: Payer: Self-pay | Admitting: Physician Assistant

## 2015-07-05 ENCOUNTER — Ambulatory Visit (INDEPENDENT_AMBULATORY_CARE_PROVIDER_SITE_OTHER): Payer: Medicare Other | Admitting: Physician Assistant

## 2015-07-05 VITALS — BP 110/76 | HR 83 | Temp 97.8°F | Resp 16 | Ht 73.75 in | Wt 196.2 lb

## 2015-07-05 DIAGNOSIS — Z1159 Encounter for screening for other viral diseases: Secondary | ICD-10-CM

## 2015-07-05 DIAGNOSIS — Z0000 Encounter for general adult medical examination without abnormal findings: Secondary | ICD-10-CM | POA: Diagnosis not present

## 2015-07-05 NOTE — Patient Instructions (Signed)

## 2015-07-05 NOTE — Progress Notes (Signed)
Subjective:    Patient ID: Peter Newman, male    DOB: [birth year], 70 y.o.   MRN: [id number]    PCP: Kennon Portela, MD  Chief Complaint  Patient presents with  . Annual Exam      HPI Presents for annual Wellness visit. Last CPE was in 01/2014 with Dr. Elder Cyphers.  In general, he's doing well.  He has a tremor, intentional, that remains stable from previous years.  He notes the sensation that there is a thick pad on the bottom of the RIGHT foot. No pain, swelling. No burning. No trauma or injury recalled. He does use an instrument to pare a callous medial to the head of the first metatarsal. Has been using an Arnica gel, but isn't sure it makes a difference.  Long-standing low back pain is managed by not lifting heavy things, but does prevent him from playing golf. He stays active by walking and shooting hoops.  Recently saw dermatology for skin cancer screening [facility] [place]). There were no cancerous/precancerous lesions, but she did identify 4 warts on the penile shaft. These were treated with cryotherapy. Monogamous sex x 16 years.  Allergies  Allergen Reactions  . Nsaids Other (See Comments)    constipation    Patient Active Problem List   Diagnosis Date Noted  . Acute recurrent maxillary sinusitis 04/22/2015  . Seasonal and perennial allergic rhinitis 04/22/2015  . Neuropathy ([place]) 02/14/2012    Family History  Problem Relation Age of Onset  . Lung disease Father   . Heart disease Brother   . Colon cancer Neg Hx   . Colon polyps Neg Hx   . Heart disease Sister     AFib  . Cancer Brother     lung  . Lung cancer Brother     Social History   Social History  . Marital Status: Divorced    Spouse Name: long-time girlfriend  . Number of Children: 2  . Years of Education: Mastersx3   Occupational History  . semi-retired    Social History Main Topics  . Smoking status: Former Smoker -- 0.50 packs/day for 8 years    Types: Cigarettes    Start date:  05/21/1964    Quit date: 05/21/1972  . Smokeless tobacco: Never Used  . Alcohol Use: No  . Drug Use: No  . Sexual Activity: Yes   Other Topics Concern  . Not on file   Social History Narrative   Single-long-time girlfriend   Education: Masters Degrees (3)   Exercise: Yes        Review of Systems  Constitutional: Negative.   HENT: Negative.   Eyes: Negative.   Respiratory: Negative.   Cardiovascular: Negative.   Gastrointestinal: Negative.   Endocrine: Negative.   Genitourinary: Negative.   Musculoskeletal: Negative.   Skin: Negative.   Allergic/Immunologic: Negative.   Neurological: Positive for tremors.  Hematological: Negative.   Psychiatric/Behavioral: Negative.        Objective:   Physical Exam  Constitutional: He is oriented to person, place, and time. He appears well-developed and well-nourished. He is active and cooperative. No distress.  BP 110/76 mmHg  Pulse 83  Temp(Src) 97.8 F (36.6 C) (Oral)  Resp 16  Ht 6' 1.75" (1.873 m)  Wt 196 lb 3.2 oz (88.996 kg)  BMI 25.37 kg/m2  SpO2 98%  HENT:  Head: Normocephalic and atraumatic.  Right Ear: Hearing, tympanic membrane, external ear and ear canal normal.  Left Ear: Hearing, tympanic membrane, external ear and  ear canal normal.  Nose: Nose normal.  Mouth/Throat: Uvula is midline, oropharynx is clear and moist and mucous membranes are normal. Normal dentition. No uvula swelling.  Eyes: Conjunctivae, EOM and lids are normal. Pupils are equal, round, and reactive to light. No scleral icterus.  Fundoscopic exam:      The right eye shows no hemorrhage and no papilledema. The right eye shows red reflex.       The left eye shows no hemorrhage and no papilledema. The left eye shows red reflex.  Neck: Normal range of motion, full passive range of motion without pain and phonation normal. Neck supple. No spinous process tenderness and no muscular tenderness present. No thyromegaly present.  Cardiovascular: Normal  rate, regular rhythm and normal heart sounds.   Pulses:      Radial pulses are 2+ on the right side, and 2+ on the left side.  Pulmonary/Chest: Effort normal and breath sounds normal.  Abdominal: Normal appearance and bowel sounds are normal. There is no hepatosplenomegaly. There is no tenderness.  Musculoskeletal:       Cervical back: Normal.       Thoracic back: Normal.       Lumbar back: Normal.  Lymphadenopathy:       Head (right side): No tonsillar, no preauricular, no posterior auricular and no occipital adenopathy present.       Head (left side): No tonsillar, no preauricular, no posterior auricular and no occipital adenopathy present.    He has no cervical adenopathy.       Right: No supraclavicular adenopathy present.       Left: No supraclavicular adenopathy present.  Neurological: He is alert and oriented to person, place, and time. He has normal strength. No cranial nerve deficit or sensory deficit. He displays a negative Romberg sign.  Skin: Skin is warm, dry and intact. No rash noted. No cyanosis or erythema. Nails show no clubbing.  Psychiatric: He has a normal mood and affect. His speech is normal and behavior is normal.   He had labs drawn last month in preparation for this exam, and has already been notified of the results. We note that Hep C screening was not performed, and he would like that today.       Assessment & Plan:  1. Medicare annual wellness visit, subsequent Age appropriate anticipatory guidance provided.  2. Need for hepatitis C screening test - Hepatitis C antibody   Fara Chute, PA-C Physician Assistant-Certified [facility]

## 2015-07-06 LAB — HEPATITIS C ANTIBODY: HCV AB: NEGATIVE

## 2015-08-02 ENCOUNTER — Ambulatory Visit: Payer: Medicare Other | Admitting: Internal Medicine

## 2015-10-31 DIAGNOSIS — H2513 Age-related nuclear cataract, bilateral: Secondary | ICD-10-CM | POA: Diagnosis not present

## 2016-01-04 ENCOUNTER — Other Ambulatory Visit: Payer: Self-pay | Admitting: Internal Medicine

## 2016-01-05 DIAGNOSIS — Z23 Encounter for immunization: Secondary | ICD-10-CM | POA: Diagnosis not present

## 2016-01-06 NOTE — Telephone Encounter (Signed)
Chelle, you saw pt for CPE in Feb and wanted him to RTC in 1 year for next CPE. This was on pt's med list, but we haven't Rxd it sine 2015. Wanted to make sure you want to Rx it?

## 2016-01-07 NOTE — Telephone Encounter (Signed)
Meds ordered this encounter  Medications  . L-Methylfolate-Algae-B12-B6 (METANX) 3-90.314-2-35 MG CAPS    Sig: Take 1 capsule by mouth twice daily.    Dispense:  180 capsule    Refill:  1

## 2016-05-30 DIAGNOSIS — D485 Neoplasm of uncertain behavior of skin: Secondary | ICD-10-CM | POA: Diagnosis not present

## 2016-05-30 DIAGNOSIS — L821 Other seborrheic keratosis: Secondary | ICD-10-CM | POA: Diagnosis not present

## 2016-05-30 DIAGNOSIS — Z23 Encounter for immunization: Secondary | ICD-10-CM | POA: Diagnosis not present

## 2016-06-01 DIAGNOSIS — L568 Other specified acute skin changes due to ultraviolet radiation: Secondary | ICD-10-CM | POA: Diagnosis not present

## 2016-06-01 DIAGNOSIS — L570 Actinic keratosis: Secondary | ICD-10-CM | POA: Diagnosis not present

## 2016-07-04 DIAGNOSIS — L821 Other seborrheic keratosis: Secondary | ICD-10-CM | POA: Diagnosis not present

## 2016-07-04 DIAGNOSIS — L570 Actinic keratosis: Secondary | ICD-10-CM | POA: Diagnosis not present

## 2016-07-04 DIAGNOSIS — Z411 Encounter for cosmetic surgery: Secondary | ICD-10-CM | POA: Diagnosis not present

## 2016-09-21 ENCOUNTER — Encounter: Payer: Self-pay | Admitting: Physician Assistant

## 2016-09-21 ENCOUNTER — Ambulatory Visit (INDEPENDENT_AMBULATORY_CARE_PROVIDER_SITE_OTHER): Payer: Medicare Other | Admitting: Physician Assistant

## 2016-09-21 VITALS — BP 114/79 | HR 80 | Temp 98.7°F | Resp 16 | Ht 73.75 in | Wt 197.2 lb

## 2016-09-21 DIAGNOSIS — R5383 Other fatigue: Secondary | ICD-10-CM

## 2016-09-21 DIAGNOSIS — R350 Frequency of micturition: Secondary | ICD-10-CM | POA: Diagnosis not present

## 2016-09-21 DIAGNOSIS — Z0000 Encounter for general adult medical examination without abnormal findings: Secondary | ICD-10-CM

## 2016-09-21 DIAGNOSIS — E538 Deficiency of other specified B group vitamins: Secondary | ICD-10-CM | POA: Diagnosis not present

## 2016-09-21 DIAGNOSIS — L821 Other seborrheic keratosis: Secondary | ICD-10-CM

## 2016-09-21 DIAGNOSIS — E781 Pure hyperglyceridemia: Secondary | ICD-10-CM

## 2016-09-21 NOTE — Patient Instructions (Addendum)
It was a pleasure meeting you today. Please start increasing your non weight bearing activities like biking and swimming as this will help with knee pain. Also start doing yoga as it is just overall beneficial for your health and well being. Please come back if you have any problems in the next year, otherwise, follow up in one year. We will contact you in the next few days with your lab results. Thank you for letting me participate in your health and well being.   Health Maintenance, Male A healthy lifestyle and preventive care is important for your health and wellness. Ask your health care provider about what schedule of regular examinations is right for you. What should I know about weight and diet?  Eat a Healthy Diet  Eat plenty of vegetables, fruits, whole grains, low-fat dairy products, and lean protein.  Do not eat a lot of foods high in solid fats, added sugars, or salt. Maintain a Healthy Weight  Regular exercise can help you achieve or maintain a healthy weight. You should:  Do at least 150 minutes of exercise each week. The exercise should increase your heart rate and make you sweat (moderate-intensity exercise).  Do strength-training exercises at least twice a week. Watch Your Levels of Cholesterol and Blood Lipids  Have your blood tested for lipids and cholesterol every 5 years starting at 73 years of age. If you are at high risk for heart disease, you should start having your blood tested when you are 73 years old. You may need to have your cholesterol levels checked more often if:  Your lipid or cholesterol levels are high.  You are older than 73 years of age.  You are at high risk for heart disease. What should I know about cancer screening? Many types of cancers can be detected early and may often be prevented. Lung Cancer  You should be screened every year for lung cancer if:  You are a current smoker who has smoked for at least 30 years.  You are a former smoker  who has quit within the past 15 years.  Talk to your health care provider about your screening options, when you should start screening, and how often you should be screened. Colorectal Cancer  Routine colorectal cancer screening usually begins at 73 years of age and should be repeated every 5-10 years until you are 73 years old. You may need to be screened more often if early forms of precancerous polyps or small growths are found. Your health care provider may recommend screening at an earlier age if you have risk factors for colon cancer.  Your health care provider may recommend using home test kits to check for hidden blood in the stool.  A small camera at the end of a tube can be used to examine your colon (sigmoidoscopy or colonoscopy). This checks for the earliest forms of colorectal cancer. Prostate and Testicular Cancer  Depending on your age and overall health, your health care provider may do certain tests to screen for prostate and testicular cancer.  Talk to your health care provider about any symptoms or concerns you have about testicular or prostate cancer. Skin Cancer  Check your skin from head to toe regularly.  Tell your health care provider about any new moles or changes in moles, especially if:  There is a change in a mole's size, shape, or color.  You have a mole that is larger than a pencil eraser.  Always use sunscreen. Apply sunscreen liberally and  repeat throughout the day.  Protect yourself by wearing long sleeves, pants, a wide-brimmed hat, and sunglasses when outside. What should I know about heart disease, diabetes, and high blood pressure?  If you are 83-49 years of age, have your blood pressure checked every 3-5 years. If you are 47 years of age or older, have your blood pressure checked every year. You should have your blood pressure measured twice-once when you are at a hospital or clinic, and once when you are not at a hospital or clinic. Record the  average of the two measurements. To check your blood pressure when you are not at a hospital or clinic, you can use:  An automated blood pressure machine at a pharmacy.  A home blood pressure monitor.  Talk to your health care provider about your target blood pressure.  If you are between 43-70 years old, ask your health care provider if you should take aspirin to prevent heart disease.  Have regular diabetes screenings by checking your fasting blood sugar level.  If you are at a normal weight and have a low risk for diabetes, have this test once every three years after the age of 60.  If you are overweight and have a high risk for diabetes, consider being tested at a younger age or more often.  A one-time screening for abdominal aortic aneurysm (AAA) by ultrasound is recommended for men aged 37-75 years who are current or former smokers. What should I know about preventing infection? Hepatitis B  If you have a higher risk for hepatitis B, you should be screened for this virus. Talk with your health care provider to find out if you are at risk for hepatitis B infection. Hepatitis C  Blood testing is recommended for:  Everyone born from [birth year] through 1967.  Anyone with known risk factors for hepatitis C. Sexually Transmitted Diseases (STDs)  You should be screened each year for STDs including gonorrhea and chlamydia if:  You are sexually active and are younger than 73 years of age.  You are older than 73 years of age and your health care provider tells you that you are at risk for this type of infection.  Your sexual activity has changed since you were last screened and you are at an increased risk for chlamydia or gonorrhea. Ask your health care provider if you are at risk.  Talk with your health care provider about whether you are at high risk of being infected with HIV. Your health care provider may recommend a prescription medicine to help prevent HIV infection. What else can I  do?  Schedule regular health, dental, and eye exams.  Stay current with your vaccines (immunizations).  Do not use any tobacco products, such as cigarettes, chewing tobacco, and e-cigarettes. If you need help quitting, ask your health care provider.  Limit alcohol intake to no more than 2 drinks per day. One drink equals 12 ounces of beer, 5 ounces of wine, or 1 ounces of hard liquor.  Do not use street drugs.  Do not share needles.  Ask your health care provider for help if you need support or information about quitting drugs.  Tell your health care provider if you often feel depressed.  Tell your health care provider if you have ever been abused or do not feel safe at home. This information is not intended to replace advice given to you by your health care provider. Make sure you discuss any questions you have with your health care provider.  Document Released: 11/03/2007 Document Revised: 01/04/2016 Document Reviewed: 02/08/2015 Elsevier Interactive Patient Education  2017 [employer]  Essential Tremor A tremor is trembling or shaking that you cannot control. Most tremors affect the hands or arms. Tremors can also affect the head, vocal cords, face, and other parts of the body. Essential tremor is a tremor without a known cause. What are the causes? Essential tremor has no known cause. What increases the risk? You may be at greater risk of essential tremor if:  You have a family member with essential tremor.  You are age 52 or older.  You take certain medicines. What are the signs or symptoms? The main sign of a tremor is uncontrolled and unintentional rhythmic shaking of a body part.  You may have difficulty eating with a spoon or fork.  You may have difficulty writing.  You may nod your head up and down or side to side.  You may have a quivering voice. Your tremors:  May get worse over time.  May come and go.  May be more noticeable on one side of your  body.  May get worse due to stress, fatigue, caffeine, and extreme heat or cold. How is this diagnosed? Your health care provider can diagnose essential tremor based on your symptoms, medical history, and a physical examination. There is no single test to diagnose an essential tremor. However, your health care provider may perform a variety of tests to rule out other conditions. Tests may include:  Blood and urine tests.  Imaging studies of your brain, such as:  CT scan.  MRI.  A test that measures involuntary muscle movement (electromyogram). How is this treated? Your tremors may go away without treatment. Mild tremors may not need treatment if they do not affect your day-to-day life. Severe tremors may need to be treated using one or a combination of the following options:  Medicines. This may include medicine that is injected.  Lifestyle changes.  Physical therapy. Follow these instructions at home:  Take medicines only as directed by your health care provider.  Limit alcohol intake to no more than 1 drink per day for nonpregnant women and 2 drinks per day for men. One drink equals 12 oz of beer, 5 oz of wine, or 1 oz of hard liquor.  Do not use any tobacco products, including cigarettes, chewing tobacco, or electronic cigarettes. If you need help quitting, ask your health care provider.  Take medicines only as directed by your health care provider.  Avoid extreme heat or cold.  Limit the amount of caffeine you consumeas directed by your health care provider.  Try to get eight hours of sleep each night.  Find ways to manage your stress, such as meditation or yoga.  Keep all follow-up visits as directed by your health care provider. This is important. This includes any physical therapy visits. Contact a health care provider if:  You experience any changes in the location or intensity of your tremors.  You start having a tremor after starting a new medicine.  You  have tremor with other symptoms such as:  Numbness.  Tingling.  Pain.  Weakness. Your tremor gets worse. Osteoarthritis Osteoarthritis is a type of arthritis that affects tissue that covers the ends of bones in joints (cartilage). Cartilage acts as a cushion between the bones and helps them move smoothly. Osteoarthritis results when cartilage in the joints gets worn down. Osteoarthritis is sometimes called "wear and tear" arthritis. Osteoarthritis is the most common form of  arthritis. It often occurs in older people. It is a condition that gets worse over time (a progressive condition). Joints that are most often affected by this condition are in: Fingers. Toes. Hips. Knees. Spine, including neck and lower back. What are the causes? This condition is caused by age-related wearing down of cartilage that covers the ends of bones. What increases the risk? The following factors may make you more likely to develop this condition: Older age. Being overweight or obese. Overuse of joints, such as in athletes. Past injury of a joint. Past surgery on a joint. Family history of osteoarthritis. What are the signs or symptoms? The main symptoms of this condition are pain, swelling, and stiffness in the joint. The joint may lose its shape over time. Small pieces of bone or cartilage may break off and float inside of the joint, which may cause more pain and damage to the joint. Small deposits of bone (osteophytes) may grow on the edges of the joint. Other symptoms may include: A grating or scraping feeling inside the joint when you move it. Popping or creaking sounds when you move. Symptoms may affect one or more joints. Osteoarthritis in a major joint, such as your knee or hip, can make it painful to walk or exercise. If you have osteoarthritis in your hands, you might not be able to grip items, twist your hand, or control small movements of your hands and fingers (fine motor skills). How is this  diagnosed? This condition may be diagnosed based on: Your medical history. A physical exam. Your symptoms. X-rays of the affected joint(s). Blood tests to rule out other types of arthritis. How is this treated? There is no cure for this condition, but treatment can help to control pain and improve joint function. Treatment plans may include: A prescribed exercise program that allows for rest and joint relief. You may work with a physical therapist. A weight control plan. Pain relief techniques, such as: Applying heat and cold to the joint. Electric pulses delivered to nerve endings under the skin (transcutaneous electrical nerve stimulation, or TENS). Massage. Certain nutritional supplements. NSAIDs or prescription medicines to help relieve pain. Medicine to help relieve pain and inflammation (corticosteroids). This can be given by mouth (orally) or as an injection. Assistive devices, such as a brace, wrap, splint, specialized glove, or cane. Surgery, such as: An osteotomy. This is done to reposition the bones and relieve pain or to remove loose pieces of bone and cartilage. Joint replacement surgery. You may need this surgery if you have very bad (advanced) osteoarthritis. Follow these instructions at home: Activity  Rest your affected joints as directed by your health care provider. Do not drive or use heavy machinery while taking prescription pain medicine. Exercise as directed. Your health care provider or physical therapist may recommend specific types of exercise, such as: Strengthening exercises. These are done to strengthen the muscles that support joints that are affected by arthritis. They can be performed with weights or with exercise bands to add resistance. Aerobic activities. These are exercises, such as brisk walking or water aerobics, that get your heart pumping. Range-of-motion activities. These keep your joints easy to move. Balance and agility exercises. Managing  pain, stiffness, and swelling  If directed, apply heat to the affected area as often as told by your health care provider. Use the heat source that your health care provider recommends, such as a moist heat pack or a heating pad. If you have a removable assistive device, remove it  as told by your health care provider. Place a towel between your skin and the heat source. If your health care provider tells you to keep the assistive device on while you apply heat, place a towel between the assistive device and the heat source. Leave the heat on for 20-30 minutes. Remove the heat if your skin turns bright red. This is especially important if you are unable to feel pain, heat, or cold. You may have a greater risk of getting burned. If directed, put ice on the affected joint: If you have a removable assistive device, remove it as told by your health care provider. Put ice in a plastic bag. Place a towel between your skin and the bag. If your health care provider tells you to keep the assistive device on during icing, place a towel between the assistive device and the bag. Leave the ice on for 20 minutes, 2-3 times a day. General instructions  Take over-the-counter and prescription medicines only as told by your health care provider. Maintain a healthy weight. Follow instructions from your health care provider for weight control. These may include dietary restrictions. Do not use any products that contain nicotine or tobacco, such as cigarettes and e-cigarettes. These can delay bone healing. If you need help quitting, ask your health care provider. Use assistive devices as directed by your health care provider. Keep all follow-up visits as told by your health care provider. This is important. Where to find more information: [employer] of Arthritis and Musculoskeletal and Skin Diseases: www.niams.SouthEx[employer].es [employer] on Aging: http://kim-miller.com/ American College of Rheumatology:  www.rheumatology.org Contact a health care provider if: Your skin turns red. You develop a rash. You have pain that gets worse. You have a fever along with joint or muscle aches. Get help right away if: You lose a lot of weight. You suddenly lose your appetite. You have night sweats. Summary Osteoarthritis is a type of arthritis that affects tissue covering the ends of bones in joints (cartilage). This condition is caused by age-related wearing down of cartilage that covers the ends of bones. The main symptom of this condition is pain, swelling, and stiffness in the joint. There is no cure for this condition, but treatment can help to control pain and improve joint function. This information is not intended to replace advice given to you by your health care provider. Make sure you discuss any questions you have with your health care provider. Document Released: 05/07/2005 Document Revised: 01/09/2016 Document Reviewed: 01/09/2016 Elsevier Interactive Patient Education  2017 [employer].    Your tremor interferes with your daily life. This information is not intended to replace advice given to you by your health care provider. Make sure you discuss any questions you have with your health care provider. Document Released: 05/28/2014 Document Revised: 10/13/2015 Document Reviewed: 11/02/2013 Elsevier Interactive Patient Education  2017 [employer].   IF you received an x-ray today, you will receive an invoice from [facility]. Please contact [facility] at [phone] with questions or concerns regarding your invoice.   IF you received labwork today, you will receive an invoice from [facility]. Please contact [facility] at [phone] with questions or concerns regarding your invoice.   Our billing staff will not be able to assist you with questions regarding bills from these companies.  You will be contacted with the lab results as soon as they are available. The  fastest way to get your results is to activate your My Chart account. Instructions are located on  the last page of this paperwork. If you have not heard from [place] regarding the results in 2 weeks, please contact this office.

## 2016-09-21 NOTE — Progress Notes (Signed)
Subjective:    Peter Newman is a 71 y.o. male who presents for Medicare Annual/Subsequent preventive examination.   Preventive Screening-Counseling & Management  Tobacco History  Smoking Status  . Former Smoker  . Packs/day: 0.50  . Years: 8.00  . Types: Cigarettes  . Start date: 05/21/1964  . Quit date: 05/21/1972  Smokeless Tobacco  . Never Used    Problems Prior to Visit 1) Vitamin B12 deficiency: He will intermittently get hot feet and fatigue. Takes metanx as needed when he is symptomatic. He does not need a refill for metanx at this time.   2) Essential Tremor: Has been evaluated by Neurology for this five years ago. Notes the tremor does seem to be getting progressively worse. He is still not at the point where he feels as if wants medication for it but notes he will follow up when that day comes.    3) Left knee pain: Has been experiencing intermittent left knee pain for the past year. Notes it sometimes locks up on him while he is on his walks. He is very active for his age. Likes to walk daily, bike, and travel. He denies swelling, redness, and warmth. Will often wear a brace, which helps. Denies using any medication for pain.   Current Problems (verified) Patient Active Problem List   Diagnosis Date Noted  . Acute recurrent maxillary sinusitis 04/22/2015  . Seasonal and perennial allergic rhinitis 04/22/2015  . Neuropathy 02/14/2012    Medications Prior to Visit Current Outpatient Prescriptions on File Prior to Visit  Medication Sig Dispense Refill  . aspirin 81 MG tablet Take 81 mg by mouth daily. Pt states he takes everyday except when he has sinus issues    . Cyanocobalamin (VITAMIN B 12 PO) Take by mouth.    Marland Kitchen L-Methylfolate-Algae-B12-B6 (METANX) 3-90.314-2-35 MG CAPS Take 1 capsule by mouth twice daily. 180 capsule 1  . Multiple Vitamin (MULTIVITAMIN) tablet Take 1 tablet by mouth daily.    Marland Kitchen ibuprofen (ADVIL,MOTRIN) 200 MG tablet Take 200 mg by mouth every 6  (six) hours as needed. Reported on 06/05/2015     No current facility-administered medications on file prior to visit.     Current Medications (verified) Current Outpatient Prescriptions  Medication Sig Dispense Refill  . aspirin 81 MG tablet Take 81 mg by mouth daily. Pt states he takes everyday except when he has sinus issues    . Cyanocobalamin (VITAMIN B 12 PO) Take by mouth.    Marland Kitchen L-Methylfolate-Algae-B12-B6 (METANX) 3-90.314-2-35 MG CAPS Take 1 capsule by mouth twice daily. 180 capsule 1  . Multiple Vitamin (MULTIVITAMIN) tablet Take 1 tablet by mouth daily.    Marland Kitchen ibuprofen (ADVIL,MOTRIN) 200 MG tablet Take 200 mg by mouth every 6 (six) hours as needed. Reported on 06/05/2015     No current facility-administered medications for this visit.      Allergies (verified) Nsaids   PAST HISTORY  Family History Family History  Problem Relation Age of Onset  . Lung disease Father   . Heart disease Brother   . Heart disease Sister     AFib  . Cancer Brother     lung  . Lung cancer Brother   . Colon cancer Neg Hx   . Colon polyps Neg Hx     Social History Social History  Substance Use Topics  . Smoking status: Former Smoker    Packs/day: 0.50    Years: 8.00    Types: Cigarettes    Start date: 05/21/1964  Quit date: 05/21/1972  . Smokeless tobacco: Never Used  . Alcohol use No    Are there smokers in your home (other than you)?  No  Risk Factors Current exercise habits: Home exercise routine includes walking 1 hrs per day and cycling.  Dietary issues discussed: Eats a variety of food. Will eat a bagel with egg on it every morning, typically a chicken salad for lunch, and something light for dinner like spaghetti squash. Drinks mostly coffee and water.    Cardiac risk factors: advanced age (older than 31 for men, 64 for women) and male gender.  Depression Screen Depression screen [facility] 2/9 09/21/2016 07/05/2015 06/05/2015  Decreased Interest 0 0 0  Down, Depressed, Hopeless 0  0 0  PHQ - 2 Score 0 0 0   Fall Risk  09/21/2016 09/21/2016 07/05/2015 06/05/2015  Falls in the past year? Yes No Yes No  Number falls in past yr: 2 or more - - -  Injury with Fall? No - - -      Activities of Daily Living In your present state of health, do you have any difficulty performing the following activities?:  Driving? Yes Managing money?  Yes Feeding yourself? Yes Getting from bed to chair? Yes Climbing a flight of stairs? Yes Preparing food and eating?: Yes Bathing or showering? Yes Getting dressed: Yes Getting to the toilet? Yes Using the toilet:Yes Moving around from place to place: Yes In the past year have you fallen or had a near fall?:2-3 times in the past year, all provoked from tripping while not paying attention   Are you sexually active?  Yes  Do you have more than one partner?  No  Hearing Difficulties: No Do you often ask people to speak up or repeat themselves? No Do you experience ringing or noises in your ears? No Do you have difficulty understanding soft or whispered voices? No   Do you feel that you have a problem with memory? No  Do you often misplace items? No  Do you feel safe at home?  Yes  Cognitive Testing  Alert? Yes  Normal Appearance?Yes  Oriented to person? Yes  Place? Yes   Time? Yes  Recall of three objects?  Yes  Can perform simple calculations? Yes  Displays appropriate judgment?Yes  Can read the correct time from a watch face?Yes   Advanced Directives have been discussed with the patient? Yes  His daughter, Dicky Doe, is his HCPOA. Has living will established. Code status: DNR. He is an organ donor.   List the Names of Other Physician/Practitioners you currently use:  Patient Care Team: Leonie Douglas, PA-C as PCP - General (Physician Assistant)  Indicate any recent Medical Services you may have received from other than [facility] providers in the past year (date may be approximate).  Immunization History  Administered  Date(s) Administered  . Influenza Split 02/08/2013  . Influenza,inj,Quad PF,36+ Mos 01/23/2014  . Influenza-Unspecified 01/25/2015, 01/05/2016  . Pneumococcal Conjugate-13 01/23/2014  . Pneumococcal Polysaccharide-23 08/25/2012  . Tdap 08/25/2012    Screening Tests Health Maintenance  Topic Date Due  . INFLUENZA VACCINE  12/19/2016  . COLONOSCOPY  01/10/2018  . TETANUS/TDAP  08/26/2022  . Hepatitis C Screening  Completed  . PNA vac Low Risk Adult  Completed    All answers were reviewed with the patient and necessary referrals were made:  Leonie Douglas, PA-C   09/21/2016   History reviewed: allergies, current medications, past family history, past medical history, past social  history, past surgical history and problem list  Review of Systems  Constitutional: Negative.   HENT: Negative.   Eyes: Negative.   Respiratory: Negative.   Cardiovascular: Negative.   Gastrointestinal: Negative.   Genitourinary: Negative.   Musculoskeletal: Positive for back pain and joint pain. Negative for falls, myalgias and neck pain.  Skin: Negative.   Neurological: Negative.   Endo/Heme/Allergies: Negative.   Psychiatric/Behavioral: Negative.      Objective:     Blood pressure 114/79, pulse 80, temperature 98.7 F (37.1 C), temperature source Oral, resp. rate 16, height 6' 1.75" (1.873 m), weight 197 lb 3.2 oz (89.4 kg), SpO2 98 %. Body mass index is 25.49 kg/m.   Visual Acuity Screening   Right eye Left eye Both eyes  Without correction: '20/50 20/50 20/30 '  With correction:      Physical Exam  Constitutional: He is oriented to person, place, and time. He appears well-developed and well-nourished. No distress.  HENT:  Head: Normocephalic and atraumatic.  Right Ear: There is drainage (cerumen impaction blocking view of TM ).  Left Ear: There is drainage (cerumen impaction blocking view of TM ).  Nose: Right sinus exhibits no maxillary sinus tenderness and no frontal sinus  tenderness. Left sinus exhibits no maxillary sinus tenderness and no frontal sinus tenderness.  Mouth/Throat: Uvula is midline, oropharynx is clear and moist and mucous membranes are normal.  Eyes: Conjunctivae and EOM are normal. Pupils are equal, round, and reactive to light.  Neck: Normal range of motion.  Cardiovascular: Normal rate, regular rhythm, normal heart sounds and intact distal pulses.   Pulmonary/Chest: Effort normal and breath sounds normal.  Abdominal: Soft. Bowel sounds are normal. There is no tenderness.  Musculoskeletal: Normal range of motion.       Right knee: No tenderness found.       Left knee: No tenderness found.  Lymphadenopathy:       Head (right side): No submental, no submandibular, no tonsillar, no preauricular, no posterior auricular and no occipital adenopathy present.       Head (left side): No submental, no submandibular, no tonsillar, no preauricular, no posterior auricular and no occipital adenopathy present.    He has no cervical adenopathy.       Right: No supraclavicular adenopathy present.       Left: No supraclavicular adenopathy present.  Neurological: He is alert and oriented to person, place, and time. He has normal strength. He displays no tremor.  Reflex Scores:      Tricep reflexes are 2+ on the right side and 2+ on the left side.      Bicep reflexes are 2+ on the right side and 2+ on the left side.      Brachioradialis reflexes are 2+ on the right side and 2+ on the left side.      Patellar reflexes are 2+ on the right side and 2+ on the left side.      Achilles reflexes are 2+ on the right side and 2+ on the left side. Skin: Skin is warm and dry.     Psychiatric: He has a normal mood and affect.  Vitals reviewed.      Assessment and Plan:  1. Encounter for Medicare annual wellness exam Await lab results, plan for follow up in one year.   2. Vitamin B12 deficiency - CBC with Differential/Platelet - Vitamin B12  3.  Hypertriglyceridemia - CMP14+EGFR - Lipid panel  4. Other fatigue - Vitamin B12  5. Urinary frequency -  PSA  6. Seborrheic keratosis PE findings consistent with seborrheic keratosis, he has already been evaluated for this by a dermatologist and was told it was benign. Will follow up with dermatology if he develops any new concerning skin lesions.     During the course of the visit the patient was educated and counseled about appropriate screening and preventive services including:   There are no preventive care reminders to display for this patient.   Diet review for nutrition referral? Not Indicated   Patient Instructions (the written plan) was given to the patient.  Medicare Attestation I have personally reviewed: The patient's medical and social history Their use of alcohol, tobacco or illicit drugs Their current medications and supplements The patient's functional ability including ADLs,fall risks, home safety risks, cognitive, and hearing and visual impairment Diet and physical activities Evidence for depression or mood disorders  The patient's weight, height, BMI, and visual acuity have been recorded in the chart.  I have made referrals, counseling, and provided education to the patient based on review of the above and I have provided the patient with a written personalized care plan for preventive services.     Leonie Douglas, PA-C   09/21/2016

## 2016-09-22 LAB — CBC WITH DIFFERENTIAL/PLATELET
BASOS: 0 %
Basophils Absolute: 0 10*3/uL (ref 0.0–0.2)
EOS (ABSOLUTE): 0.1 10*3/uL (ref 0.0–0.4)
EOS: 2 %
HEMATOCRIT: 46.4 % (ref 37.5–51.0)
HEMOGLOBIN: 15.9 g/dL (ref 13.0–17.7)
IMMATURE GRANULOCYTES: 0 %
Immature Grans (Abs): 0 10*3/uL (ref 0.0–0.1)
LYMPHS ABS: 1.2 10*3/uL (ref 0.7–3.1)
Lymphs: 29 %
MCH: 31.9 pg (ref 26.6–33.0)
MCHC: 34.3 g/dL (ref 31.5–35.7)
MCV: 93 fL (ref 79–97)
MONOCYTES: 9 %
MONOS ABS: 0.4 10*3/uL (ref 0.1–0.9)
NEUTROS PCT: 60 %
Neutrophils Absolute: 2.5 10*3/uL (ref 1.4–7.0)
Platelets: 214 10*3/uL (ref 150–379)
RBC: 4.98 x10E6/uL (ref 4.14–5.80)
RDW: 14.1 % (ref 12.3–15.4)
WBC: 4.1 10*3/uL (ref 3.4–10.8)

## 2016-09-22 LAB — LIPID PANEL
Chol/HDL Ratio: 3.4 ratio (ref 0.0–5.0)
Cholesterol, Total: 179 mg/dL (ref 100–199)
HDL: 52 mg/dL (ref >39)
LDL CALC: 109 mg/dL — AB (ref 0–99)
Triglycerides: 90 mg/dL (ref 0–149)
VLDL Cholesterol Cal: 18 mg/dL (ref 5–40)

## 2016-09-22 LAB — CMP14+EGFR
A/G RATIO: 1.6 (ref 1.2–2.2)
ALBUMIN: 4.1 g/dL (ref 3.5–4.8)
ALT: 21 IU/L (ref 0–44)
AST: 21 IU/L (ref 0–40)
Alkaline Phosphatase: 75 IU/L (ref 39–117)
BUN / CREAT RATIO: 15 (ref 10–24)
BUN: 17 mg/dL (ref 8–27)
Bilirubin Total: 0.4 mg/dL (ref 0.0–1.2)
CALCIUM: 8.8 mg/dL (ref 8.6–10.2)
CO2: 24 mmol/L (ref 18–29)
CREATININE: 1.15 mg/dL (ref 0.76–1.27)
Chloride: 103 mmol/L (ref 96–106)
GFR calc Af Amer: 73 mL/min/{1.73_m2} (ref >59)
GFR, EST NON AFRICAN AMERICAN: 63 mL/min/{1.73_m2} (ref >59)
GLOBULIN, TOTAL: 2.6 g/dL (ref 1.5–4.5)
Glucose: 106 mg/dL — ABNORMAL HIGH (ref 65–99)
POTASSIUM: 4.5 mmol/L (ref 3.5–5.2)
SODIUM: 141 mmol/L (ref 134–144)
Total Protein: 6.7 g/dL (ref 6.0–8.5)

## 2016-09-22 LAB — PSA: PROSTATE SPECIFIC AG, SERUM: 3.7 ng/mL (ref 0.0–4.0)

## 2016-09-22 LAB — VITAMIN B12: VITAMIN B 12: 541 pg/mL (ref 232–1245)

## 2016-09-26 ENCOUNTER — Encounter: Payer: Medicare Other | Admitting: Physician Assistant

## 2016-10-02 ENCOUNTER — Telehealth: Payer: Self-pay | Admitting: Physician Assistant

## 2016-10-02 NOTE — Telephone Encounter (Signed)
Pt given lab results and expressed his appreciation of taking good care of him.

## 2016-10-02 NOTE — Telephone Encounter (Signed)
Pt is calling to have someone read his lab results.  He has left a message at the lab phone but is anxious for a call back.  Please advise [phone]

## 2016-10-17 DIAGNOSIS — L309 Dermatitis, unspecified: Secondary | ICD-10-CM | POA: Diagnosis not present

## 2016-10-17 DIAGNOSIS — L570 Actinic keratosis: Secondary | ICD-10-CM | POA: Diagnosis not present

## 2016-10-17 DIAGNOSIS — D2272 Melanocytic nevi of left lower limb, including hip: Secondary | ICD-10-CM | POA: Diagnosis not present

## 2016-10-17 DIAGNOSIS — L821 Other seborrheic keratosis: Secondary | ICD-10-CM | POA: Diagnosis not present

## 2016-10-17 DIAGNOSIS — Z411 Encounter for cosmetic surgery: Secondary | ICD-10-CM | POA: Diagnosis not present

## 2016-10-17 DIAGNOSIS — D1801 Hemangioma of skin and subcutaneous tissue: Secondary | ICD-10-CM | POA: Diagnosis not present

## 2016-10-17 DIAGNOSIS — L814 Other melanin hyperpigmentation: Secondary | ICD-10-CM | POA: Diagnosis not present

## 2016-10-17 DIAGNOSIS — W57XXXA Bitten or stung by nonvenomous insect and other nonvenomous arthropods, initial encounter: Secondary | ICD-10-CM | POA: Diagnosis not present

## 2016-11-07 ENCOUNTER — Ambulatory Visit: Payer: Medicare Other | Admitting: Family Medicine

## 2017-01-07 ENCOUNTER — Other Ambulatory Visit: Payer: Self-pay | Admitting: Physician Assistant

## 2017-01-07 ENCOUNTER — Telehealth: Payer: Self-pay | Admitting: Physician Assistant

## 2017-01-07 NOTE — Telephone Encounter (Signed)
Pt called requesting 30 day refill of Metanx/ Vitamin B. He is leaving for [place] on 02/04/17 and would like to take the prescription with him. He is not currently symptomatic. Pt pharmacy is at [facility] at [address].

## 2017-01-25 DIAGNOSIS — Z23 Encounter for immunization: Secondary | ICD-10-CM | POA: Diagnosis not present

## 2017-10-01 ENCOUNTER — Telehealth: Payer: Self-pay | Admitting: Physician Assistant

## 2017-10-01 NOTE — Telephone Encounter (Signed)
Tried to call pt to reschedule his appt with Timmothy Euler on 10/30/17 for his CPE. Timmothy Euler is unavailable this day. I could not leave VM as pt does not have it set up and home phone rings once and then goes busy signal. If pt calls back, please reschedule him for a CPE with Timmothy Euler at his convenience. Thanks!

## 2017-10-02 ENCOUNTER — Ambulatory Visit (INDEPENDENT_AMBULATORY_CARE_PROVIDER_SITE_OTHER): Payer: Medicare Other | Admitting: Physician Assistant

## 2017-10-02 ENCOUNTER — Encounter: Payer: Self-pay | Admitting: Physician Assistant

## 2017-10-02 ENCOUNTER — Other Ambulatory Visit: Payer: Self-pay

## 2017-10-02 VITALS — BP 118/78 | HR 98 | Temp 98.2°F | Resp 18 | Ht 73.15 in | Wt 199.0 lb

## 2017-10-02 DIAGNOSIS — R5383 Other fatigue: Secondary | ICD-10-CM | POA: Diagnosis not present

## 2017-10-02 DIAGNOSIS — E538 Deficiency of other specified B group vitamins: Secondary | ICD-10-CM | POA: Diagnosis not present

## 2017-10-02 DIAGNOSIS — Z13220 Encounter for screening for lipoid disorders: Secondary | ICD-10-CM | POA: Diagnosis not present

## 2017-10-02 DIAGNOSIS — R350 Frequency of micturition: Secondary | ICD-10-CM | POA: Diagnosis not present

## 2017-10-02 DIAGNOSIS — H547 Unspecified visual loss: Secondary | ICD-10-CM

## 2017-10-02 DIAGNOSIS — Z0000 Encounter for general adult medical examination without abnormal findings: Secondary | ICD-10-CM | POA: Diagnosis not present

## 2017-10-02 DIAGNOSIS — R739 Hyperglycemia, unspecified: Secondary | ICD-10-CM

## 2017-10-02 NOTE — Patient Instructions (Addendum)
Go get an eye exam! Keep up the good work. I will contact you within the next week with your lab results.     Health Maintenance, Male A healthy lifestyle and preventive care is important for your health and wellness. Ask your health care provider about what schedule of regular examinations is right for you. What should I know about weight and diet? Eat a Healthy Diet  Eat plenty of vegetables, fruits, whole grains, low-fat dairy products, and lean protein.  Do not eat a lot of foods high in solid fats, added sugars, or salt.  Maintain a Healthy Weight Regular exercise can help you achieve or maintain a healthy weight. You should:  Do at least 150 minutes of exercise each week. The exercise should increase your heart rate and make you sweat (moderate-intensity exercise).  Do strength-training exercises at least twice a week.  Watch Your Levels of Cholesterol and Blood Lipids  Have your blood tested for lipids and cholesterol every 5 years starting at 74 years of age. If you are at high risk for heart disease, you should start having your blood tested when you are 74 years old. You may need to have your cholesterol levels checked more often if: ? Your lipid or cholesterol levels are high. ? You are older than 74 years of age. ? You are at high risk for heart disease.  What should I know about cancer screening? Many types of cancers can be detected early and may often be prevented. Lung Cancer  You should be screened every year for lung cancer if: ? You are a current smoker who has smoked for at least 30 years. ? You are a former smoker who has quit within the past 15 years.  Talk to your health care provider about your screening options, when you should start screening, and how often you should be screened.  Colorectal Cancer  Routine colorectal cancer screening usually begins at 74 years of age and should be repeated every 5-10 years until you are 74 years old. You may need to  be screened more often if early forms of precancerous polyps or small growths are found. Your health care provider may recommend screening at an earlier age if you have risk factors for colon cancer.  Your health care provider may recommend using home test kits to check for hidden blood in the stool.  A small camera at the end of a tube can be used to examine your colon (sigmoidoscopy or colonoscopy). This checks for the earliest forms of colorectal cancer.  Prostate and Testicular Cancer  Depending on your age and overall health, your health care provider may do certain tests to screen for prostate and testicular cancer.  Talk to your health care provider about any symptoms or concerns you have about testicular or prostate cancer.  Skin Cancer  Check your skin from head to toe regularly.  Tell your health care provider about any new moles or changes in moles, especially if: ? There is a change in a mole's size, shape, or color. ? You have a mole that is larger than a pencil eraser.  Always use sunscreen. Apply sunscreen liberally and repeat throughout the day.  Protect yourself by wearing long sleeves, pants, a wide-brimmed hat, and sunglasses when outside.  What should I know about heart disease, diabetes, and high blood pressure?  If you are 42-33 years of age, have your blood pressure checked every 3-5 years. If you are 65 years of age or older,  have your blood pressure checked every year. You should have your blood pressure measured twice-once when you are at a hospital or clinic, and once when you are not at a hospital or clinic. Record the average of the two measurements. To check your blood pressure when you are not at a hospital or clinic, you can use: ? An automated blood pressure machine at a pharmacy. ? A home blood pressure monitor.  Talk to your health care provider about your target blood pressure.  If you are between 73-4 years old, ask your health care provider if  you should take aspirin to prevent heart disease.  Have regular diabetes screenings by checking your fasting blood sugar level. ? If you are at a normal weight and have a low risk for diabetes, have this test once every three years after the age of 11. ? If you are overweight and have a high risk for diabetes, consider being tested at a younger age or more often.  A one-time screening for abdominal aortic aneurysm (AAA) by ultrasound is recommended for men aged 74-75 years who are current or former smokers. What should I know about preventing infection? Hepatitis B If you have a higher risk for hepatitis B, you should be screened for this virus. Talk with your health care provider to find out if you are at risk for hepatitis B infection. Hepatitis C Blood testing is recommended for:  Everyone born from [birth year] through 1967.  Anyone with known risk factors for hepatitis C.  Sexually Transmitted Diseases (STDs)  You should be screened each year for STDs including gonorrhea and chlamydia if: ? You are sexually active and are younger than 74 years of age. ? You are older than 74 years of age and your health care provider tells you that you are at risk for this type of infection. ? Your sexual activity has changed since you were last screened and you are at an increased risk for chlamydia or gonorrhea. Ask your health care provider if you are at risk.  Talk with your health care provider about whether you are at high risk of being infected with HIV. Your health care provider may recommend a prescription medicine to help prevent HIV infection.  What else can I do?  Schedule regular health, dental, and eye exams.  Stay current with your vaccines (immunizations).  Do not use any tobacco products, such as cigarettes, chewing tobacco, and e-cigarettes. If you need help quitting, ask your health care provider.  Limit alcohol intake to no more than 2 drinks per day. One drink equals 12 ounces of  beer, 5 ounces of wine, or 1 ounces of hard liquor.  Do not use street drugs.  Do not share needles.  Ask your health care provider for help if you need support or information about quitting drugs.  Tell your health care provider if you often feel depressed.  Tell your health care provider if you have ever been abused or do not feel safe at home. This information is not intended to replace advice given to you by your health care provider. Make sure you discuss any questions you have with your health care provider. Document Released: 11/03/2007 Document Revised: 01/04/2016 Document Reviewed: 02/08/2015 Elsevier Interactive Patient Education  2018 [employer].   IF you received an x-ray today, you will receive an invoice from [facility]. Please contact [facility] at [phone] with questions or concerns regarding your invoice.   IF you received labwork today, you will receive  an Pharmacologist from [facility]. Please contact [facility] at [phone] with questions or concerns regarding your invoice.   Our billing staff will not be able to assist you with questions regarding bills from these companies.  You will be contacted with the lab results as soon as they are available. The fastest way to get your results is to activate your My Chart account. Instructions are located on the last page of this paperwork. If you have not heard from [place] regarding the results in 2 weeks, please contact this office.

## 2017-10-02 NOTE — Progress Notes (Signed)
Presents today for [employer] Visit-Subsequent.   Date of last exam: 09/21/2016  Interpreter used for this visit? no  Patient Care Team: Donzetta Kohut as PCP - General (Physician Assistant)   Other items to address today: none  Problems Prior to Visit 1) Vitamin B12 deficiency:  Takes metanx as needed when he is symptomatic. His sx will typically be extreme fatigue and hot feet.   2) Essential Tremors: Has been evaluated by Neurology for this six years ago. Notes the tremor does seem to be getting progressively worse. Takes notes a lot and this is when it is the most bothersome. Not interrupting his lifestyle enough to seek tx options at this time.    Diet: Eats well balanced diet. Drinks mostly coffee with splenda.  Exercise: Walks daily. Plays basketball occasionally.   Last vision exam: Years ago, cannot remember, wears readers.   Cancer Screening: Colon: yes, last one was 01/11/2015, needs repeat this year Prostate: yes   Other Screening: Last screening for diabetes: 2018, had elevated fasting glucose of 106 last year Last lipid screening: 2018  ADVANCE DIRECTIVES: Discussed: yes Patient desires CPR (No ), mechanical ventilation (No ), prolonged artificial support (may include mechanical ventilation, tube/PEG feeding, etc) (No ). On File: yes Materials Provided: yes  Immunization status:  Immunization History  Administered Date(s) Administered  . Influenza Split 02/08/2013  . Influenza,inj,Quad PF,6+ Mos 01/23/2014  . Influenza-Unspecified 01/25/2015, 01/05/2016  . Pneumococcal Conjugate-13 01/23/2014  . Pneumococcal Polysaccharide-23 08/25/2012  . Tdap 08/25/2012  . Zoster Recombinat (Shingrix) 01/07/2017     There are no preventive care reminders to display for this patient.   Functional Status Survey: Is the patient deaf or have difficulty hearing?: No Does the patient have difficulty seeing, even when wearing  glasses/contacts?: No Does the patient have difficulty concentrating, remembering, or making decisions?: No Does the patient have difficulty walking or climbing stairs?: No Does the patient have difficulty dressing or bathing?: No Does the patient have difficulty doing errands alone such as visiting a doctor's office or shopping?: No  Home Environment: Lives home alone. Feels safe at home. No issues with ADL's.   Urinary Incontinence Screening: (+) urinary frequency, nocturia (4-5 x a night), issues starting to the stream; (-) urinary dribbling and ED.    6CIT Screen 10/02/2017  What Year? 0 points  What month? 0 points  What time? 0 points  Count back from 20 0 points  Months in reverse 0 points  Repeat phrase 0 points  Total Score 0      Patient Active Problem List   Diagnosis Date Noted  . Acute recurrent maxillary sinusitis 04/22/2015  . Seasonal and perennial allergic rhinitis 04/22/2015  . Neuropathy 02/14/2012     Past Medical History:  Diagnosis Date  . Allergy   . Anxiety      Past Surgical History:  Procedure Laterality Date  . CHOLECYSTECTOMY     1996     Family History  Problem Relation Age of Onset  . Lung disease Father   . Heart disease Brother   . Heart disease Sister        AFib  . Cancer Brother        lung  . Lung cancer Brother   . Colon cancer Neg Hx   . Colon polyps Neg Hx      Social History   Socioeconomic History  . Marital status: Divorced    Spouse name: g/f Jamas Lav  .  Number of children: 2  . Years of education: Mastersx3  . Highest education level: Not on file  Occupational History  . Occupation: Retired  Scientific laboratory technician  . Financial resource strain: Not hard at all  . Food insecurity:    Worry: Never true    Inability: Never true  . Transportation needs:    Medical: No    Non-medical: No  Tobacco Use  . Smoking status: Former Smoker    Packs/day: 0.50    Years: 8.00    Pack years: 4.00    Types: Cigarettes     Start date: 05/21/1964    Last attempt to quit: 05/21/1972    Years since quitting: 45.3  . Smokeless tobacco: Never Used  Substance and Sexual Activity  . Alcohol use: No    Alcohol/week: 0.0 oz  . Drug use: No  . Sexual activity: Yes    Partners: Female    Comment: He has an 74 year old monogamous girlfriend   Lifestyle  . Physical activity:    Days per week: Not on file    Minutes per session: Not on file  . Stress: Not on file  Relationships  . Social connections:    Talks on phone: Not on file    Gets together: Not on file    Attends religious service: Not on file    Active member of club or organization: Not on file    Attends meetings of clubs or organizations: Not on file    Relationship status: Not on file  . Intimate partner violence:    Fear of current or ex partner: Not on file    Emotionally abused: Not on file    Physically abused: Not on file    Forced sexual activity: Not on file  Other Topics Concern  . 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 [employer] live in [place]. Single-long-time girlfriend.Education: Masters Degrees (3).         Allergies  Allergen Reactions  . Nsaids Other (See Comments)    constipation     Prior to Admission medications   Medication Sig Start Date End Date Taking? Authorizing Provider  Cyanocobalamin (VITAMIN B 12 PO) Take by mouth.   Yes [provider]  ibuprofen (ADVIL,MOTRIN) 200 MG tablet Take 200 mg by mouth every 6 (six) hours as needed. Reported on 06/05/2015   Yes [provider]  L-Methylfolate-Algae-B12-B6 Glade Stanford) 3-90.314-2-35 MG CAPS take 1 capsule by mouth twice a day 01/07/17  Yes Timmothy Euler, [place] D, PA-C  Multiple Vitamin (MULTIVITAMIN) tablet Take 1 tablet by mouth daily.   Yes [provider]  aspirin 81 MG tablet Take 81 mg by mouth daily. Pt states he takes everyday except when he has sinus  issues    [provider]     Depression screen [facility] 2/9 10/02/2017 09/21/2016 07/05/2015 06/05/2015  Decreased Interest 0 0 0 0  Down, Depressed, Hopeless 0 0 0 0  PHQ - 2 Score 0 0 0 0     Fall Risk  10/02/2017 09/21/2016 09/21/2016 07/05/2015 06/05/2015  Falls in the past year? Yes Yes No Yes No  Comment - - - mistakenly fell downstairs -  Number falls in past yr: 1 2 or more - - -  Injury with Fall? No No - - -      PHYSICAL EXAM: BP 118/78 (BP Location: Right Arm, Patient  Position: Sitting, Cuff Size: Large)   Pulse 98   Temp 98.2 F (36.8 C) (Oral)   Resp 18   Ht 6' 1.15" (1.858 m)   Wt 199 lb (90.3 kg)   SpO2 96%   BMI 26.15 kg/m    Wt Readings from Last 3 Encounters:  10/02/17 199 lb (90.3 kg)  09/21/16 197 lb 3.2 oz (89.4 kg)  07/05/15 196 lb 3.2 oz (89 kg)     Visual Acuity Screening   Right eye Left eye Both eyes  Without correction: '20/40 20/70 20/25 '  With correction:        Physical Exam  Constitutional: He is oriented to person, place, and time. He appears well-developed and well-nourished. No distress.  HENT:  Head: Normocephalic and atraumatic.  Right Ear: Hearing, tympanic membrane, external ear and ear canal normal.  Left Ear: Hearing, tympanic membrane, external ear and ear canal normal.  Nose: Nose normal.  Mouth/Throat: Uvula is midline, oropharynx is clear and moist and mucous membranes are normal.  Eyes: Pupils are equal, round, and reactive to light. Conjunctivae and EOM are normal.  Fundoscopic exam:      The right eye shows no AV nicking, no exudate and no papilledema. The right eye shows red reflex.       The left eye shows no AV nicking, no exudate and no papilledema. The left eye shows red reflex.  Neck: Normal range of motion and full passive range of motion without pain. No thyromegaly present.  Cardiovascular: Normal rate, regular rhythm, normal heart sounds and intact distal pulses.  Respiratory: Effort normal and breath sounds  normal. He has no decreased breath sounds. He has no wheezes. He has no rhonchi. He has no rales.  GI: Soft. Normal appearance and bowel sounds are normal. There is no tenderness.  Musculoskeletal:  30 second chair stand test score of 12  Neurological: He is alert and oriented to person, place, and time. He has normal strength and normal reflexes. He displays tremor (essential tremor noted). No sensory deficit.  Skin: Skin is warm, dry and intact.  Psychiatric: He has a normal mood and affect. His speech is normal and behavior is normal. Judgment and thought content normal. Cognition and memory are normal.     Education/Counseling provided regarding diet and exercise, prevention of chronic diseases, smoking/tobacco cessation, if applicable, and reviewed "Covered Medicare Preventive Services."   ASSESSMENT/PLAN: 1. Encounter for Medicare annual wellness exam 2. Vitamin B12 deficiency - Vitamin B12 - CBC with Differential/Platelet  3. Hyperglycemia - Hemoglobin A1c  4. Urinary frequency - PSA - CMP14+EGFR  5. Other fatigue - TSH  6. Screening cholesterol leve - Lipid panel  7. Decreased visual acuity Encouraged pt to schedule eye exam. He agrees to do so.   Tenna Delaine, PA-C  [facility] 10/02/2017 12:59 PM

## 2017-10-03 DIAGNOSIS — H2513 Age-related nuclear cataract, bilateral: Secondary | ICD-10-CM | POA: Diagnosis not present

## 2017-10-03 LAB — LIPID PANEL
CHOL/HDL RATIO: 3.6 ratio (ref 0.0–5.0)
Cholesterol, Total: 207 mg/dL — ABNORMAL HIGH (ref 100–199)
HDL: 57 mg/dL (ref >39)
LDL Calculated: 130 mg/dL — ABNORMAL HIGH (ref 0–99)
Triglycerides: 99 mg/dL (ref 0–149)
VLDL CHOLESTEROL CAL: 20 mg/dL (ref 5–40)

## 2017-10-03 LAB — CBC WITH DIFFERENTIAL/PLATELET
BASOS ABS: 0 10*3/uL (ref 0.0–0.2)
Basos: 0 %
EOS (ABSOLUTE): 0.1 10*3/uL (ref 0.0–0.4)
Eos: 2 %
HEMOGLOBIN: 16.4 g/dL (ref 13.0–17.7)
Hematocrit: 49 % (ref 37.5–51.0)
IMMATURE GRANS (ABS): 0 10*3/uL (ref 0.0–0.1)
IMMATURE GRANULOCYTES: 0 %
LYMPHS: 30 %
Lymphocytes Absolute: 1.5 10*3/uL (ref 0.7–3.1)
MCH: 31.4 pg (ref 26.6–33.0)
MCHC: 33.5 g/dL (ref 31.5–35.7)
MCV: 94 fL (ref 79–97)
MONOCYTES: 9 %
Monocytes Absolute: 0.4 10*3/uL (ref 0.1–0.9)
NEUTROS ABS: 3 10*3/uL (ref 1.4–7.0)
NEUTROS PCT: 59 %
PLATELETS: 235 10*3/uL (ref 150–379)
RBC: 5.23 x10E6/uL (ref 4.14–5.80)
RDW: 13.9 % (ref 12.3–15.4)
WBC: 5.1 10*3/uL (ref 3.4–10.8)

## 2017-10-03 LAB — CMP14+EGFR
ALT: 22 IU/L (ref 0–44)
AST: 18 IU/L (ref 0–40)
Albumin/Globulin Ratio: 1.4 (ref 1.2–2.2)
Albumin: 4.2 g/dL (ref 3.5–4.8)
Alkaline Phosphatase: 81 IU/L (ref 39–117)
BILIRUBIN TOTAL: 0.6 mg/dL (ref 0.0–1.2)
BUN/Creatinine Ratio: 12 (ref 10–24)
BUN: 15 mg/dL (ref 8–27)
CHLORIDE: 108 mmol/L — AB (ref 96–106)
CO2: 19 mmol/L — ABNORMAL LOW (ref 20–29)
Calcium: 9.4 mg/dL (ref 8.6–10.2)
Creatinine, Ser: 1.23 mg/dL (ref 0.76–1.27)
GFR calc non Af Amer: 58 mL/min/{1.73_m2} — ABNORMAL LOW (ref >59)
GFR, EST AFRICAN AMERICAN: 67 mL/min/{1.73_m2} (ref >59)
GLUCOSE: 98 mg/dL (ref 65–99)
Globulin, Total: 3 g/dL (ref 1.5–4.5)
POTASSIUM: 4.8 mmol/L (ref 3.5–5.2)
Sodium: 145 mmol/L — ABNORMAL HIGH (ref 134–144)
TOTAL PROTEIN: 7.2 g/dL (ref 6.0–8.5)

## 2017-10-03 LAB — PSA: PROSTATE SPECIFIC AG, SERUM: 3.2 ng/mL (ref 0.0–4.0)

## 2017-10-03 LAB — HEMOGLOBIN A1C
Est. average glucose Bld gHb Est-mCnc: 108 mg/dL
HEMOGLOBIN A1C: 5.4 % (ref 4.8–5.6)

## 2017-10-03 LAB — VITAMIN B12: VITAMIN B 12: 335 pg/mL (ref 232–1245)

## 2017-10-03 LAB — TSH: TSH: 1.62 u[IU]/mL (ref 0.450–4.500)

## 2017-10-10 ENCOUNTER — Telehealth: Payer: Self-pay

## 2017-10-10 NOTE — Telephone Encounter (Signed)
Pt would like to be advised via mychart on most recent labs.

## 2017-10-11 ENCOUNTER — Other Ambulatory Visit: Payer: Self-pay | Admitting: Physician Assistant

## 2017-10-11 MED ORDER — ATORVASTATIN CALCIUM 20 MG PO TABS: 20.0000 mg | ORAL_TABLET | Freq: Every day | ORAL | 3 refills | Status: DC

## 2017-10-11 NOTE — Progress Notes (Signed)
Meds ordered this encounter  Medications  . atorvastatin (LIPITOR) 20 MG tablet    Sig: Take 1 tablet (20 mg total) by mouth daily.    Dispense:  90 tablet    Refill:  3    Order Specific Question:   Supervising Provider    Answer:   SMITH, KRISTI M [[id number]]    

## 2017-10-30 ENCOUNTER — Encounter: Payer: Medicare Other | Admitting: Physician Assistant

## 2018-01-21 DIAGNOSIS — Z23 Encounter for immunization: Secondary | ICD-10-CM | POA: Diagnosis not present

## 2018-01-27 ENCOUNTER — Encounter

## 2018-01-27 ENCOUNTER — Ambulatory Visit: Payer: Medicare Other | Admitting: Podiatry

## 2018-01-28 DIAGNOSIS — D225 Melanocytic nevi of trunk: Secondary | ICD-10-CM | POA: Diagnosis not present

## 2018-01-28 DIAGNOSIS — D2272 Melanocytic nevi of left lower limb, including hip: Secondary | ICD-10-CM | POA: Diagnosis not present

## 2018-01-28 DIAGNOSIS — L814 Other melanin hyperpigmentation: Secondary | ICD-10-CM | POA: Diagnosis not present

## 2018-01-28 DIAGNOSIS — L738 Other specified follicular disorders: Secondary | ICD-10-CM | POA: Diagnosis not present

## 2018-01-28 DIAGNOSIS — L821 Other seborrheic keratosis: Secondary | ICD-10-CM | POA: Diagnosis not present

## 2018-01-28 DIAGNOSIS — D1801 Hemangioma of skin and subcutaneous tissue: Secondary | ICD-10-CM | POA: Diagnosis not present

## 2018-01-28 DIAGNOSIS — L290 Pruritus ani: Secondary | ICD-10-CM | POA: Diagnosis not present

## 2018-03-12 ENCOUNTER — Encounter: Payer: Self-pay | Admitting: Gastroenterology

## 2018-03-20 ENCOUNTER — Encounter: Payer: Self-pay | Admitting: Gastroenterology

## 2018-04-03 DIAGNOSIS — M25561 Pain in right knee: Secondary | ICD-10-CM | POA: Diagnosis not present

## 2018-04-03 DIAGNOSIS — M170 Bilateral primary osteoarthritis of knee: Secondary | ICD-10-CM | POA: Diagnosis not present

## 2018-04-21 ENCOUNTER — Encounter: Payer: Self-pay | Admitting: Gastroenterology

## 2018-04-22 DIAGNOSIS — M1711 Unilateral primary osteoarthritis, right knee: Secondary | ICD-10-CM | POA: Diagnosis not present

## 2018-04-22 DIAGNOSIS — M179 Osteoarthritis of knee, unspecified: Secondary | ICD-10-CM | POA: Insufficient documentation

## 2018-04-22 DIAGNOSIS — M1710 Unilateral primary osteoarthritis, unspecified knee: Secondary | ICD-10-CM | POA: Insufficient documentation

## 2018-05-07 ENCOUNTER — Encounter: Payer: Medicare Other | Admitting: Gastroenterology

## 2018-05-27 DIAGNOSIS — M1711 Unilateral primary osteoarthritis, right knee: Secondary | ICD-10-CM | POA: Diagnosis not present

## 2018-05-28 ENCOUNTER — Ambulatory Visit (AMBULATORY_SURGERY_CENTER): Payer: Self-pay | Admitting: *Deleted

## 2018-05-28 VITALS — Ht 75.0 in | Wt 201.0 lb

## 2018-05-28 DIAGNOSIS — Z86010 Personal history of colonic polyps: Secondary | ICD-10-CM

## 2018-05-28 MED ORDER — PEG 3350-KCL-NA BICARB-NACL 420 G PO SOLR: 4000.0000 mL | Freq: Once | ORAL | 0 refills | Status: AC

## 2018-05-28 NOTE — Progress Notes (Signed)
No egg or soy allergy known to patient  No issues with past sedation with any surgeries  or procedures, no intubation problems - gall bladder surgery shut bladder down  No diet pills per patient No home 02 use per patient  No blood thinners per patient  Pt denies issues with constipation  No A fib or A flutter  EMMI video sent to pt's e mail - pt declined

## 2018-06-11 ENCOUNTER — Encounter: Payer: Self-pay | Admitting: Gastroenterology

## 2018-06-11 ENCOUNTER — Ambulatory Visit (AMBULATORY_SURGERY_CENTER): Payer: Medicare Other | Admitting: Gastroenterology

## 2018-06-11 VITALS — BP 112/68 | HR 84 | Temp 98.0°F | Resp 16 | Ht 75.0 in | Wt 201.0 lb

## 2018-06-11 DIAGNOSIS — Z86010 Personal history of colonic polyps: Secondary | ICD-10-CM | POA: Diagnosis not present

## 2018-06-11 DIAGNOSIS — D120 Benign neoplasm of cecum: Secondary | ICD-10-CM | POA: Diagnosis not present

## 2018-06-11 DIAGNOSIS — D125 Benign neoplasm of sigmoid colon: Secondary | ICD-10-CM | POA: Diagnosis not present

## 2018-06-11 DIAGNOSIS — K635 Polyp of colon: Secondary | ICD-10-CM

## 2018-06-11 DIAGNOSIS — D124 Benign neoplasm of descending colon: Secondary | ICD-10-CM | POA: Diagnosis not present

## 2018-06-11 MED ORDER — SODIUM CHLORIDE 0.9 % IV SOLN: 500.0000 mL | Freq: Once | INTRAVENOUS | Status: DC

## 2018-06-11 NOTE — Op Note (Signed)
[facility] Patient Name: Peter Newman Procedure Date: 06/11/2018 9:05 AM MRN: [id number] Endoscopist: Milus Banister , MD Age: 73 Referring MD:  Date of Birth: Feb 18, 1946 Gender: Male Account #: [id number] Procedure:                Colonoscopy Indications:              High risk colon cancer surveillance: Personal                            history of colonic polyps; colonoscopy 2016 four                            subCM polyps, three of them were adenomas Medicines:                Monitored Anesthesia Care Procedure:                Pre-Anesthesia Assessment:                           - Prior to the procedure, a History and Physical                            was performed, and patient medications and                            allergies were reviewed. The patient's tolerance of                            previous anesthesia was also reviewed. The risks                            and benefits of the procedure and the sedation                            options and risks were discussed with the patient.                            All questions were answered, and informed consent                            was obtained. Prior Anticoagulants: The patient has                            taken no previous anticoagulant or antiplatelet                            agents. ASA Grade Assessment: II - A patient with                            mild systemic disease. After reviewing the risks                            and benefits, the patient was deemed in  satisfactory condition to undergo the procedure.                           After obtaining informed consent, the colonoscope                            was passed under direct vision. Throughout the                            procedure, the patient's blood pressure, pulse, and                            oxygen saturations were monitored continuously. The                            Colonoscope was  introduced through the anus and                            advanced to the the cecum, identified by                            appendiceal orifice and ileocecal valve. The                            colonoscopy was performed without difficulty. The                            patient tolerated the procedure well. The quality                            of the bowel preparation was good. The ileocecal                            valve, appendiceal orifice, and rectum were                            photographed. Scope In: 9:12:28 AM Scope Out: 9:26:03 AM Scope Withdrawal Time: 0 hours 11 minutes 48 seconds  Total Procedure Duration: 0 hours 13 minutes 35 seconds  Findings:                 Three sessile polyps were found in the sigmoid                            colon, descending colon and cecum. The polyps were                            2 to 4 mm in size. These polyps were removed with a                            cold snare. Resection and retrieval were complete.                           The exam was otherwise without abnormality on  direct and retroflexion views. Complications:            No immediate complications. Estimated blood loss:                            None. Estimated Blood Loss:     Estimated blood loss: none. Impression:               - Three 2 to 4 mm polyps in the sigmoid colon, in                            the descending colon and in the cecum, removed with                            a cold snare. Resected and retrieved.                           - The examination was otherwise normal on direct                            and retroflexion views. Recommendation:           - Patient has a contact number available for                            emergencies. The signs and symptoms of potential                            delayed complications were discussed with the                            patient. Return to normal activities tomorrow.                             Written discharge instructions were provided to the                            patient.                           - Resume previous diet.                           - Continue present medications.                           You will receive a letter within 2-3 weeks with the                            pathology results and my final recommendations.                           If the polyp(s) is proven to be 'pre-cancerous' on                            pathology, you will need repeat colonoscopy  in 3-5                            years. Milus Banister, MD 06/11/2018 9:29:24 AM This report has been signed electronically.

## 2018-06-11 NOTE — Progress Notes (Signed)
PT taken to PACU. Monitors in place. VSS. Report given to RN. 

## 2018-06-11 NOTE — Progress Notes (Signed)
Pt's states no medical or surgical changes since previsit or office visit. 

## 2018-06-11 NOTE — Patient Instructions (Signed)
Discharge instructions given. Handout on polyps. Resume previous medications. YOU HAD AN ENDOSCOPIC PROCEDURE TODAY AT THE [facility]:   Refer to the procedure report that was given to you for any specific questions about what was found during the examination.  If the procedure report does not answer your questions, please call your gastroenterologist to clarify.  If you requested that your care partner not be given the details of your procedure findings, then the procedure report has been included in a sealed envelope for you to review at your convenience later.  YOU SHOULD EXPECT: Some feelings of bloating in the abdomen. Passage of more gas than usual.  Walking can help get rid of the air that was put into your GI tract during the procedure and reduce the bloating. If you had a lower endoscopy (such as a colonoscopy or flexible sigmoidoscopy) you may notice spotting of blood in your stool or on the toilet paper. If you underwent a bowel prep for your procedure, you may not have a normal bowel movement for a few days.  Please Note:  You might notice some irritation and congestion in your nose or some drainage.  This is from the oxygen used during your procedure.  There is no need for concern and it should clear up in a day or so.  SYMPTOMS TO REPORT IMMEDIATELY:   Following lower endoscopy (colonoscopy or flexible sigmoidoscopy):  Excessive amounts of blood in the stool  Significant tenderness or worsening of abdominal pains  Swelling of the abdomen that is new, acute  Fever of 100F or higher   For urgent or emergent issues, a gastroenterologist can be reached at any hour by calling [phone].   DIET:  We do recommend a small meal at first, but then you may proceed to your regular diet.  Drink plenty of fluids but you should avoid alcoholic beverages for 24 hours.  ACTIVITY:  You should plan to take it easy for the rest of today and you should NOT DRIVE or use heavy  machinery until tomorrow (because of the sedation medicines used during the test).    FOLLOW UP: Our staff will call the number listed on your records the next business day following your procedure to check on you and address any questions or concerns that you may have regarding the information given to you following your procedure. If we do not reach you, we will leave a message.  However, if you are feeling well and you are not experiencing any problems, there is no need to return our call.  We will assume that you have returned to your regular daily activities without incident.  If any biopsies were taken you will be contacted by phone or by letter within the next 1-3 weeks.  Please call us at [phone] if you have not heard about the biopsies in 3 weeks.    SIGNATURES/CONFIDENTIALITY: You and/or your care partner have signed paperwork which will be entered into your electronic medical record.  These signatures attest to the fact that that the information above on your After Visit Summary has been reviewed and is understood.  Full responsibility of the confidentiality of this discharge information lies with you and/or your care-partner. 

## 2018-06-11 NOTE — Progress Notes (Signed)
Called to room to assist during endoscopic procedure.  Patient ID and intended procedure confirmed with present staff. Received instructions for my participation in the procedure from the performing physician.  

## 2018-06-12 ENCOUNTER — Telehealth: Payer: Self-pay

## 2018-06-12 NOTE — Telephone Encounter (Signed)
  Follow up Call-  Call back number 06/11/2018  Post procedure Call Back phone  # [phone]  Permission to leave phone message Yes  Some recent data might be hidden     Patient questions:  Do you have a fever, pain , or abdominal swelling? No. Pain Score  0 *  Have you tolerated food without any problems? Yes.    Have you been able to return to your normal activities? Yes.    Do you have any questions about your discharge instructions: Diet   No. Medications  No. Follow up visit  No.  Do you have questions or concerns about your Care? No.  Actions: * If pain score is 4 or above: No action needed, pain <4.

## 2018-06-12 NOTE — Telephone Encounter (Signed)
First post procedure follow up call, no answer 

## 2018-06-13 DIAGNOSIS — M1711 Unilateral primary osteoarthritis, right knee: Secondary | ICD-10-CM | POA: Diagnosis not present

## 2018-06-16 ENCOUNTER — Encounter: Payer: Self-pay | Admitting: Gastroenterology

## 2018-06-18 ENCOUNTER — Ambulatory Visit (INDEPENDENT_AMBULATORY_CARE_PROVIDER_SITE_OTHER): Payer: Medicare Other | Admitting: Emergency Medicine

## 2018-06-18 ENCOUNTER — Encounter: Payer: Self-pay | Admitting: Emergency Medicine

## 2018-06-18 ENCOUNTER — Other Ambulatory Visit: Payer: Self-pay

## 2018-06-18 VITALS — BP 112/74 | HR 88 | Temp 98.6°F | Resp 16 | Ht 73.0 in | Wt 193.2 lb

## 2018-06-18 DIAGNOSIS — E785 Hyperlipidemia, unspecified: Secondary | ICD-10-CM | POA: Diagnosis not present

## 2018-06-18 LAB — COMPREHENSIVE METABOLIC PANEL
ALT: 12 IU/L (ref 0–44)
AST: 15 IU/L (ref 0–40)
Albumin/Globulin Ratio: 1.5 (ref 1.2–2.2)
Albumin: 4.1 g/dL (ref 3.7–4.7)
Alkaline Phosphatase: 77 IU/L (ref 39–117)
BUN/Creatinine Ratio: 10 (ref 10–24)
BUN: 12 mg/dL (ref 8–27)
Bilirubin Total: 0.5 mg/dL (ref 0.0–1.2)
CO2: 22 mmol/L (ref 20–29)
CREATININE: 1.24 mg/dL (ref 0.76–1.27)
Calcium: 9.3 mg/dL (ref 8.6–10.2)
Chloride: 103 mmol/L (ref 96–106)
GFR calc Af Amer: 66 mL/min/{1.73_m2} (ref >59)
GFR calc non Af Amer: 57 mL/min/{1.73_m2} — ABNORMAL LOW (ref >59)
Globulin, Total: 2.7 g/dL (ref 1.5–4.5)
Glucose: 99 mg/dL (ref 65–99)
Potassium: 4.7 mmol/L (ref 3.5–5.2)
Sodium: 139 mmol/L (ref 134–144)
Total Protein: 6.8 g/dL (ref 6.0–8.5)

## 2018-06-18 LAB — LIPID PANEL
Chol/HDL Ratio: 2.8 ratio (ref 0.0–5.0)
Cholesterol, Total: 152 mg/dL (ref 100–199)
HDL: 55 mg/dL (ref >39)
LDL Calculated: 82 mg/dL (ref 0–99)
TRIGLYCERIDES: 75 mg/dL (ref 0–149)
VLDL Cholesterol Cal: 15 mg/dL (ref 5–40)

## 2018-06-18 NOTE — Progress Notes (Signed)
Peter Newman 73 y.o.   Chief Complaint  Patient presents with  . Hyperlipidemia    to check elevated cholesterol    HISTORY OF PRESENT ILLNESS: This is a 73 y.o. male concern about elevated cholesterol.  Blood work last May showed mild increase of total cholesterol and LDL cholesterol of 130.  Advised to start Lipitor but he never did.  Has been exercising and eating well.  Has lost 10 pounds.  Asymptomatic.  No other medical concerns.  HPI   Prior to Admission medications   Medication Sig Start Date End Date Taking? Authorizing Provider  aspirin 81 MG tablet Take 81 mg by mouth daily. Pt states he takes everyday except when he has sinus issues   Yes [provider]  Cyanocobalamin (VITAMIN B 12 PO) Take by mouth.   Yes [provider]  ibuprofen (ADVIL,MOTRIN) 200 MG tablet Take 200 mg by mouth every 6 (six) hours as needed. Reported on 06/05/2015   Yes [provider]  L-Methylfolate-Algae-B12-B6 Glade Stanford) [id number] MG CAPS take 1 capsule by mouth twice a day 01/07/17  Yes Timmothy Euler, [place] D, PA-C  Multiple Vitamin (MULTIVITAMIN) tablet Take 1 tablet by mouth daily.   Yes [provider]  atorvastatin (LIPITOR) 20 MG tablet Take 1 tablet (20 mg total) by mouth daily. Patient not taking: Reported on 06/18/2018 10/11/17   Tenna Delaine D, PA-C    Allergies  Allergen Reactions  . Nsaids Other (See Comments)    constipation    Patient Active Problem List   Diagnosis Date Noted  . Acute recurrent maxillary sinusitis 04/22/2015  . Seasonal and perennial allergic rhinitis 04/22/2015  . Neuropathy 02/14/2012    Past Medical History:  Diagnosis Date  . Allergy   . Anxiety    pt denies  . Arthritis    right knee   . GERD (gastroesophageal reflux disease)    on occasion  . Hyperlipidemia   . Neuromuscular disorder (HCC)    leg cramps right knee   . Vitamin B 12 deficiency     Past Surgical History:  Procedure Laterality Date    . CHOLECYSTECTOMY     1996  . COLONOSCOPY    . POLYPECTOMY      Social History   Socioeconomic History  . Marital status: Divorced    Spouse name: g/f Peter Newman  . Number of children: 2  . Years of education: Mastersx3  . Highest education level: Not on file  Occupational History  . Occupation: Retired  Scientific laboratory technician  . Financial resource strain: Not hard at all  . Food insecurity:    Worry: Never true    Inability: Never true  . Transportation needs:    Medical: No    Non-medical: No  Tobacco Use  . Smoking status: Former Smoker    Packs/day: 0.50    Years: 8.00    Pack years: 4.00    Types: Cigarettes    Start date: 05/21/1964    Last attempt to quit: 05/21/1972    Years since quitting: 46.1  . Smokeless tobacco: Never Used  Substance and Sexual Activity  . Alcohol use: No    Alcohol/week: 0.0 standard drinks  . Drug use: No  . Sexual activity: Yes    Partners: Female    Comment: He has an 72 year old monogamous girlfriend   Lifestyle  . Physical activity:    Days per week: Not on file    Minutes per session: Not on file  . Stress:  Not on file  Relationships  . Social connections:    Talks on phone: Not on file    Gets together: Not on file    Attends religious service: Not on file    Active member of club or organization: Not on file    Attends meetings of clubs or organizations: Not on file    Relationship status: Not on file  . Intimate partner violence:    Fear of current or ex partner: Not on file    Emotionally abused: Not on file    Physically abused: Not on file    Forced sexual activity: Not on file  Other Topics Concern  . 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 [employer] live in [place]. Single-long-time girlfriend.Education: Masters Degrees (3).        Family History  Problem Relation Age of Onset  . Lung disease Father   . Heart disease Brother    . Heart disease Sister        AFib  . Cancer Brother        lung  . Lung cancer Brother   . Colon cancer Neg Hx   . Colon polyps Neg Hx   . Esophageal cancer Neg Hx   . Stomach cancer Neg Hx   . Rectal cancer Neg Hx      Review of Systems  Constitutional: Negative.  Negative for fever.  HENT: Negative.   Respiratory: Negative.  Negative for cough and shortness of breath.   Cardiovascular: Negative.  Negative for chest pain and palpitations.  Gastrointestinal: Negative.  Negative for abdominal pain, nausea and vomiting.  Skin: Negative.  Negative for rash.  Neurological: Negative.  Negative for dizziness and headaches.  Endo/Heme/Allergies: Negative.       Vitals:   06/18/18 1013  BP: 112/74  Pulse: 88  Resp: 16  Temp: 98.6 F (37 C)  SpO2: 98%   Physical Exam Vitals signs reviewed.  Constitutional:      Appearance: Normal appearance.  HENT:     Head: Normocephalic.  Eyes:     Extraocular Movements: Extraocular movements intact.     Pupils: Pupils are equal, round, and reactive to light.  Neck:     Musculoskeletal: Normal range of motion.  Cardiovascular:     Rate and Rhythm: Normal rate and regular rhythm.     Heart sounds: Normal heart sounds.  Pulmonary:     Breath sounds: Normal breath sounds.  Musculoskeletal: Normal range of motion.  Skin:    General: Skin is warm and dry.     Capillary Refill: Capillary refill takes less than 2 seconds.  Neurological:     General: No focal deficit present.     Mental Status: He is alert and oriented to person, place, and time.  Psychiatric:        Mood and Affect: Mood normal.        Behavior: Behavior normal.      ASSESSMENT & PLAN: Peter Newman was seen today for hyperlipidemia.  Diagnoses and all orders for this visit:  Dyslipidemia -     Comprehensive metabolic panel -     Lipid panel    Patient Instructions       If you have lab work done today you will be contacted with your lab results within  the next 2 weeks.  If you have not heard from [place] then please  contact us. The fastest way to get your results is to register for My Chart.   IF you received an x-ray today, you will receive an invoice from [facility]. Please contact [facility] at [phone] with questions or concerns regarding your invoice.   IF you received labwork today, you will receive an invoice from [facility]. Please contact [facility] at [phone] with questions or concerns regarding your invoice.   Our billing staff will not be able to assist you with questions regarding bills from these companies.  You will be contacted with the lab results as soon as they are available. The fastest way to get your results is to activate your My Chart account. Instructions are located on the last page of this paperwork. If you have not heard from [place] regarding the results in 2 weeks, please contact this office.     Cholesterol  Cholesterol is a fat. Your body needs a small amount of cholesterol. Cholesterol (plaque) may build up in your blood vessels (arteries). That makes you more likely to have a heart attack or stroke. You cannot feel your cholesterol level. Having a blood test is the only way to find out if your level is high. Keep your test results. Work with your doctor to keep your cholesterol at a good level. What do the results mean?  Total cholesterol is how much cholesterol is in your blood.  LDL is bad cholesterol. This is the type that can build up. Try to have low LDL.  HDL is good cholesterol. It cleans your blood vessels and carries LDL away. Try to have high HDL.  Triglycerides are fat that the body can store or burn for energy. What are good levels of cholesterol?  Total cholesterol below 200.  LDL below 100 is good for people who have health risks. LDL below 70 is good for people who have very high risks.  HDL above 40 is good. It is best to have HDL of 60 or higher.  Triglycerides  below 150. How can I lower my cholesterol? Diet Follow your diet program as told by your doctor.  Choose fish, white meat chicken, or [place] that is roasted or baked. Try not to eat red meat, fried foods, sausage, or lunch meats.  Eat lots of fresh fruits and vegetables.  Choose whole grains, beans, pasta, potatoes, and cereals.  Choose olive oil, corn oil, or canola oil. Only use small amounts.  Try not to eat butter, mayonnaise, shortening, or palm kernel oils.  Try not to eat foods with trans fats.  Choose low-fat or nonfat dairy foods. ? Drink skim or nonfat milk. ? Eat low-fat or nonfat yogurt and cheeses. ? Try not to drink whole milk or cream. ? Try not to eat ice cream, egg yolks, or full-fat cheeses.  Healthy desserts include angel food cake, ginger snaps, animal crackers, hard candy, popsicles, and low-fat or nonfat frozen yogurt. Try not to eat pastries, cakes, pies, and cookies.  Exercise Follow your exercise program as told by your doctor.  Be more active. Try gardening, walking, and taking the stairs.  Ask your doctor about ways that you can be more active. Medicine  Take over-the-counter and prescription medicines only as told by your doctor. This information is not intended to replace advice given to you by your health care provider. Make sure you discuss any questions you have with your health care provider. Document Released: 08/03/2008 Document Revised: 12/07/2015 Document Reviewed: 11/17/2015 Elsevier Interactive Patient Education  2019 Elsevier  Inc.      Agustina Caroli, MD [facility]

## 2018-06-18 NOTE — Patient Instructions (Addendum)
If you have lab work done today you will be contacted with your lab results within the next 2 weeks.  If you have not heard from [place] then please contact us. The fastest way to get your results is to register for My Chart.   IF you received an x-ray today, you will receive an invoice from [facility]. Please contact [facility] at [phone] with questions or concerns regarding your invoice.   IF you received labwork today, you will receive an invoice from [facility]. Please contact [facility] at [phone] with questions or concerns regarding your invoice.   Our billing staff will not be able to assist you with questions regarding bills from these companies.  You will be contacted with the lab results as soon as they are available. The fastest way to get your results is to activate your My Chart account. Instructions are located on the last page of this paperwork. If you have not heard from [place] regarding the results in 2 weeks, please contact this office.     Cholesterol  Cholesterol is a fat. Your body needs a small amount of cholesterol. Cholesterol (plaque) may build up in your blood vessels (arteries). That makes you more likely to have a heart attack or stroke. You cannot feel your cholesterol level. Having a blood test is the only way to find out if your level is high. Keep your test results. Work with your doctor to keep your cholesterol at a good level. What do the results mean?  Total cholesterol is how much cholesterol is in your blood.  LDL is bad cholesterol. This is the type that can build up. Try to have low LDL.  HDL is good cholesterol. It cleans your blood vessels and carries LDL away. Try to have high HDL.  Triglycerides are fat that the body can store or burn for energy. What are good levels of cholesterol?  Total cholesterol below 200.  LDL below 100 is good for people who have health risks. LDL below 70 is good for people who have very high  risks.  HDL above 40 is good. It is best to have HDL of 60 or higher.  Triglycerides below 150. How can I lower my cholesterol? Diet Follow your diet program as told by your doctor.  Choose fish, white meat chicken, or [place] that is roasted or baked. Try not to eat red meat, fried foods, sausage, or lunch meats.  Eat lots of fresh fruits and vegetables.  Choose whole grains, beans, pasta, potatoes, and cereals.  Choose olive oil, corn oil, or canola oil. Only use small amounts.  Try not to eat butter, mayonnaise, shortening, or palm kernel oils.  Try not to eat foods with trans fats.  Choose low-fat or nonfat dairy foods. ? Drink skim or nonfat milk. ? Eat low-fat or nonfat yogurt and cheeses. ? Try not to drink whole milk or cream. ? Try not to eat ice cream, egg yolks, or full-fat cheeses.  Healthy desserts include angel food cake, ginger snaps, animal crackers, hard candy, popsicles, and low-fat or nonfat frozen yogurt. Try not to eat pastries, cakes, pies, and cookies.  Exercise Follow your exercise program as told by your doctor.  Be more active. Try gardening, walking, and taking the stairs.  Ask your doctor about ways that you can be more active. Medicine  Take over-the-counter and prescription medicines only as told by your doctor. This information is not intended to replace advice given to you by  your health care provider. Make sure you discuss any questions you have with your health care provider. Document Released: 08/03/2008 Document Revised: 12/07/2015 Document Reviewed: 11/17/2015 Elsevier Interactive Patient Education  2019 [employer].

## 2018-06-20 ENCOUNTER — Encounter: Payer: Self-pay | Admitting: *Deleted

## 2018-07-11 DIAGNOSIS — M1711 Unilateral primary osteoarthritis, right knee: Secondary | ICD-10-CM | POA: Diagnosis not present

## 2018-10-06 ENCOUNTER — Other Ambulatory Visit: Payer: Self-pay

## 2018-10-06 ENCOUNTER — Ambulatory Visit (INDEPENDENT_AMBULATORY_CARE_PROVIDER_SITE_OTHER): Payer: Medicare Other | Admitting: Emergency Medicine

## 2018-10-06 VITALS — BP 107/65 | HR 64 | Ht 75.0 in | Wt 180.0 lb

## 2018-10-06 DIAGNOSIS — Z0000 Encounter for general adult medical examination without abnormal findings: Secondary | ICD-10-CM | POA: Diagnosis not present

## 2018-10-06 NOTE — Patient Instructions (Signed)
Thank you for taking time to come for your Medicare Wellness Visit. I appreciate your ongoing commitment to your health goals. Please review the following plan we discussed and let me know if I can assist you in the future.    LPN  Healthy Eating Following a healthy eating pattern may help you to achieve and maintain a healthy body weight, reduce the risk of chronic disease, and live a long and productive life. It is important to follow a healthy eating pattern at an appropriate calorie level for your body. Your nutritional needs should be met primarily through food by choosing a variety of nutrient-rich foods. What are tips for following this plan? Reading food labels  Read labels and choose the following: ? Reduced or low sodium. ? Juices with 100% fruit juice. ? Foods with low saturated fats and high polyunsaturated and monounsaturated fats. ? Foods with whole grains, such as whole wheat, cracked wheat, brown rice, and wild rice. ? Whole grains that are fortified with folic acid. This is recommended for women who are pregnant or who want to become pregnant.  Read labels and avoid the following: ? Foods with a lot of added sugars. These include foods that contain brown sugar, corn sweetener, corn syrup, dextrose, fructose, glucose, high-fructose corn syrup, honey, invert sugar, lactose, malt syrup, maltose, molasses, raw sugar, sucrose, trehalose, or turbinado sugar.  Do not eat more than the following amounts of added sugar per day:  6 teaspoons (25 g) for women.  9 teaspoons (38 g) for men. ? Foods that contain processed or refined starches and grains. ? Refined grain products, such as white flour, degermed cornmeal, white bread, and white rice. Shopping  Choose nutrient-rich snacks, such as vegetables, whole fruits, and nuts. Avoid high-calorie and high-sugar snacks, such as potato chips, fruit snacks, and candy.  Use oil-based dressings and spreads on foods instead of  solid fats such as butter, stick margarine, or cream cheese.  Limit pre-made sauces, mixes, and "instant" products such as flavored rice, instant noodles, and ready-made pasta.  Try more plant-protein sources, such as tofu, tempeh, black beans, edamame, lentils, nuts, and seeds.  Explore eating plans such as the Mediterranean diet or vegetarian diet. Cooking  Use oil to saut or stir-fry foods instead of solid fats such as butter, stick margarine, or lard.  Try baking, boiling, grilling, or broiling instead of frying.  Remove the fatty part of meats before cooking.  Steam vegetables in water or broth. Meal planning   At meals, imagine dividing your plate into fourths: ? One-half of your plate is fruits and vegetables. ? One-fourth of your plate is whole grains. ? One-fourth of your plate is protein, especially lean meats, poultry, eggs, tofu, beans, or nuts.  Include low-fat dairy as part of your daily diet. Lifestyle  Choose healthy options in all settings, including home, work, school, restaurants, or stores.  Prepare your food safely: ? Wash your hands after handling raw meats. ? Keep food preparation surfaces clean by regularly washing with hot, soapy water. ? Keep raw meats separate from ready-to-eat foods, such as fruits and vegetables. ? Cook seafood, meat, poultry, and eggs to the recommended internal temperature. ? Store foods at safe temperatures. In general:  Keep cold foods at 40F (4.4C) or below.  Keep hot foods at 140F (60C) or above.  Keep your freezer at 0F (-17.8C) or below.  Foods are no longer safe to eat when they have been between the temperatures of 40-140F (4.4-60C) for   more than 2 hours. What foods should I eat? Fruits Aim to eat 2 cup-equivalents of fresh, canned (in natural juice), or frozen fruits each day. Examples of 1 cup-equivalent of fruit include 1 small apple, 8 large strawberries, 1 cup canned fruit,  cup dried fruit, or 1 cup  100% juice. Vegetables Aim to eat 2-3 cup-equivalents of fresh and frozen vegetables each day, including different varieties and colors. Examples of 1 cup-equivalent of vegetables include 2 medium carrots, 2 cups raw, leafy greens, 1 cup chopped vegetable (raw or cooked), or 1 medium baked potato. Grains Aim to eat 6 ounce-equivalents of whole grains each day. Examples of 1 ounce-equivalent of grains include 1 slice of bread, 1 cup ready-to-eat cereal, 3 cups popcorn, or  cup cooked rice, pasta, or cereal. Meats and other proteins Aim to eat 5-6 ounce-equivalents of protein each day. Examples of 1 ounce-equivalent of protein include 1 egg, 1/2 cup nuts or seeds, or 1 tablespoon (16 g) peanut butter. A cut of meat or fish that is the size of a deck of cards is about 3-4 ounce-equivalents.  Of the protein you eat each week, try to have at least 8 ounces come from seafood. This includes salmon, trout, herring, and anchovies. Dairy Aim to eat 3 cup-equivalents of fat-free or low-fat dairy each day. Examples of 1 cup-equivalent of dairy include 1 cup (240 mL) milk, 8 ounces (250 g) yogurt, 1 ounces (44 g) natural cheese, or 1 cup (240 mL) fortified soy milk. Fats and oils  Aim for about 5 teaspoons (21 g) per day. Choose monounsaturated fats, such as canola and olive oils, avocados, peanut butter, and most nuts, or polyunsaturated fats, such as sunflower, corn, and soybean oils, walnuts, pine nuts, sesame seeds, sunflower seeds, and flaxseed. Beverages  Aim for six 8-oz glasses of water per day. Limit coffee to three to five 8-oz cups per day.  Limit caffeinated beverages that have added calories, such as soda and energy drinks.  Limit alcohol intake to no more than 1 drink a day for nonpregnant women and 2 drinks a day for men. One drink equals 12 oz of beer (355 mL), 5 oz of wine (148 mL), or 1 oz of hard liquor (44 mL). Seasoning and other foods  Avoid adding excess amounts of salt to your  foods. Try flavoring foods with herbs and spices instead of salt.  Avoid adding sugar to foods.  Try using oil-based dressings, sauces, and spreads instead of solid fats. This information is based on general [place] nutrition guidelines. For more information, visit choosemyplate.gov. Exact amounts may vary based on your nutrition needs. Summary  A healthy eating plan may help you to maintain a healthy weight, reduce the risk of chronic diseases, and stay active throughout your life.  Plan your meals. Make sure you eat the right portions of a variety of nutrient-rich foods.  Try baking, boiling, grilling, or broiling instead of frying.  Choose healthy options in all settings, including home, work, school, restaurants, or stores. This information is not intended to replace advice given to you by your health care provider. Make sure you discuss any questions you have with your health care provider. Document Released: 08/19/2017 Document Revised: 08/19/2017 Document Reviewed: 08/19/2017 Elsevier Interactive Patient Education  2019 Elsevier Inc.  

## 2018-10-06 NOTE — Progress Notes (Signed)
Presents today for [employer] Visit  I connected with  Peter Newman on 10/06/18 by a video enabled telemedicine application and verified that I am speaking with the correct person using two identifiers.    The patient expressed understanding and agreed to proceed.    Date of last exam: 06/18/2018  Interpreter used for this visit? No  Two authenticators used. Tele-med visit  Patient Care Team: Horald Pollen, MD as PCP - General (Internal Medicine)   Other items to address today:  Discussed yearly Eye/Dental exams Discussed immunizations  Patient states he is loosing weight and his knee L is feeling much better.  Will get knee replaced when he feels it is time .   [name] 1.2 miles 3x a week.   Other Screening: Last screening for diabetes: 10/02/2017 Last lipid screening: 06/18/2018  ADVANCE DIRECTIVES: Discussed: yes On File: no copy requested Materials Provided: no  Immunization status:  Immunization History  Administered Date(s) Administered  . Influenza Split 02/08/2013  . Influenza,inj,Quad PF,6+ Mos 01/23/2014  . Influenza-Unspecified 01/25/2015, 01/05/2016  . Pneumococcal Conjugate-13 01/23/2014  . Pneumococcal Polysaccharide-23 08/25/2012  . Tdap 08/25/2012  . Zoster Recombinat (Shingrix) 01/07/2017     There are no preventive care reminders to display for this patient.   Functional Status Survey: Is the patient deaf or have difficulty hearing?: No Does the patient have difficulty seeing, even when wearing glasses/contacts?: No Does the patient have difficulty concentrating, remembering, or making decisions?: No Does the patient have difficulty walking or climbing stairs?: No Does the patient have difficulty dressing or bathing?: No Does the patient have difficulty doing errands alone such as visiting a doctor's office or shopping?: No   6CIT Screen 10/06/2018 10/02/2017  What Year? 0 points 0 points  What month? 0  points 0 points  What time? 0 points 0 points  Count back from 20 0 points 0 points  Months in reverse 0 points 0 points  Repeat phrase 0 points 0 points  Total Score 0 0        Clinical Support from 10/06/2018 in [facility]  AUDIT-C Score  0       Home Environment:   [place]  Left his home in [place] in march to quarantine at his beach house in [place]   Two story home  No trouble going up More trouble coming down No Scattered rugs Yes grab bars Home well lit.   Patient Active Problem List   Diagnosis Date Noted  . Acute recurrent maxillary sinusitis 04/22/2015  . Seasonal and perennial allergic rhinitis 04/22/2015  . Neuropathy 02/14/2012     Past Medical History:  Diagnosis Date  . Allergy   . Anxiety    pt denies  . Arthritis    right knee   . GERD (gastroesophageal reflux disease)    on occasion  . Hyperlipidemia   . Neuromuscular disorder (HCC)    leg cramps right knee   . Vitamin B 12 deficiency      Past Surgical History:  Procedure Laterality Date  . CHOLECYSTECTOMY     1996  . COLONOSCOPY    . POLYPECTOMY       Family History  Problem Relation Age of Onset  . Lung disease Father   . Heart disease Brother   . Heart disease Sister        AFib  . Cancer Brother        lung  .  Lung cancer Brother   . Colon cancer Neg Hx   . Colon polyps Neg Hx   . Esophageal cancer Neg Hx   . Stomach cancer Neg Hx   . Rectal cancer Neg Hx      Social History   Socioeconomic History  . Marital status: Divorced    Spouse name: g/f Jamas Lav  . Number of children: 2  . Years of education: Mastersx3  . Highest education level: Not on file  Occupational History  . Occupation: Retired  Scientific laboratory technician  . Financial resource strain: Not hard at all  . Food insecurity:    Worry: Never true    Inability: Never true  . Transportation needs:    Medical: No    Non-medical: No  Tobacco Use  . Smoking status: Former Smoker     Packs/day: 0.50    Years: 8.00    Pack years: 4.00    Types: Cigarettes    Start date: 05/21/1964    Last attempt to quit: 05/21/1972    Years since quitting: 46.4  . Smokeless tobacco: Never Used  Substance and Sexual Activity  . Alcohol use: No    Alcohol/week: 0.0 standard drinks  . Drug use: No  . Sexual activity: Yes    Partners: Female    Comment: He has an 37 year old monogamous girlfriend   Lifestyle  . Physical activity:    Days per week: Not on file    Minutes per session: Not on file  . Stress: Not on file  Relationships  . Social connections:    Talks on phone: Not on file    Gets together: Not on file    Attends religious service: Not on file    Active member of club or organization: Not on file    Attends meetings of clubs or organizations: Not on file    Relationship status: Not on file  . Intimate partner violence:    Fear of current or ex partner: Not on file    Emotionally abused: Not on file    Physically abused: Not on file    Forced sexual activity: Not on file  Other Topics Concern  . 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 [employer] live in [place]. Single-long-time girlfriend.Education: Masters Degrees (3).         Allergies  Allergen Reactions  . Nsaids Other (See Comments)    constipation     Prior to Admission medications   Medication Sig Start Date End Date Taking? Authorizing Provider  aspirin 81 MG tablet Take 81 mg by mouth daily. Pt states he takes everyday except when he has sinus issues   Yes [provider]  cholecalciferol (VITAMIN D3) 25 MCG (1000 UT) tablet Take 1,000 Units by mouth daily.   Yes [provider]  atorvastatin (LIPITOR) 20 MG tablet Take 1 tablet (20 mg total) by mouth daily. Patient not taking: Reported on 06/18/2018 10/11/17   Tenna Delaine D, PA-C  Cyanocobalamin (VITAMIN B 12 PO) Take by mouth.     [provider]  ibuprofen (ADVIL,MOTRIN) 200 MG tablet Take 200 mg by mouth every 6 (six) hours as needed. Reported on 06/05/2015    [provider]  L-Methylfolate-Algae-B12-B6 Glade Stanford) 3-90.314-2-35 MG CAPS take 1 capsule by mouth twice a day Patient not taking: Reported on 10/06/2018 01/07/17   Leonie Douglas, PA-C  Multiple  Vitamin (MULTIVITAMIN) tablet Take 1 tablet by mouth daily.    [provider]     Depression screen [facility] 2/9 10/06/2018 06/18/2018 10/02/2017 09/21/2016 07/05/2015  Decreased Interest 0 0 0 0 0  Down, Depressed, Hopeless 0 0 0 0 0  PHQ - 2 Score 0 0 0 0 0     Fall Risk  10/06/2018 06/18/2018 10/02/2017 09/21/2016 09/21/2016  Falls in the past year? 1 1 Yes Yes No  Comment - - - - -  Number falls in past yr: 0 0 1 2 or more -  Injury with Fall? 1 0 No No -  Follow up Falls prevention discussed;Education provided;Falls evaluation completed - - - -      PHYSICAL EXAM: BP 107/65 Comment: taken at home by patient  Pulse 64   Ht 6\' 3"  (1.905 m)   Wt 180 lb (81.6 kg) Comment: patient took at home  BMI 22.50 kg/m    Wt Readings from Last 3 Encounters:  10/06/18 180 lb (81.6 kg)  06/18/18 193 lb 3.2 oz (87.6 kg)  06/11/18 201 lb (91.2 kg)     No exam data present    Physical Exam   Education/Counseling provided regarding diet and exercise, prevention of chronic diseases, smoking/tobacco cessation, if applicable, and reviewed "Covered Medicare Preventive Services."   ASSESSMENT/PLAN: There are no diagnoses linked to this encounter.

## 2018-10-07 ENCOUNTER — Ambulatory Visit: Payer: Self-pay

## 2019-01-23 DIAGNOSIS — Z23 Encounter for immunization: Secondary | ICD-10-CM | POA: Diagnosis not present

## 2019-07-20 ENCOUNTER — Ambulatory Visit: Payer: Medicare Other | Attending: Internal Medicine

## 2019-07-20 DIAGNOSIS — Z23 Encounter for immunization: Secondary | ICD-10-CM | POA: Insufficient documentation

## 2019-07-20 NOTE — Progress Notes (Signed)
   Covid-19 Vaccination Clinic  Name:  Peter Newman    MRN: [id number] DOB: [birth year]  07/20/2019  Mr. Carroway was observed post Covid-19 immunization for 15 minutes without incidence. He was provided with Vaccine Information Sheet and instruction to access the V-Safe system.   Mr. Yatsko was instructed to call 911 with any severe reactions post vaccine: Marland Kitchen Difficulty breathing  . Swelling of your face and throat  . A fast heartbeat  . A bad rash all over your body  . Dizziness and weakness    Immunizations Administered    Name Date Dose VIS Date Route   Pfizer COVID-19 Vaccine 07/20/2019  8:39 AM 0.3 mL 05/01/2019 Intramuscular   Manufacturer: [place]   Lot: [id number]   [place]: [id number]

## 2019-08-19 ENCOUNTER — Ambulatory Visit: Payer: Medicare Other | Attending: Internal Medicine

## 2019-08-19 DIAGNOSIS — Z23 Encounter for immunization: Secondary | ICD-10-CM

## 2019-08-19 NOTE — Progress Notes (Signed)
   Covid-19 Vaccination Clinic  Name:  Peter Newman    MRN: [id number] DOB: [birth year]  08/19/2019  [name] was observed post Covid-19 immunization for 15 minutes without incident. He was provided with Vaccine Information Sheet and instruction to access the V-Safe system.   [name] was instructed to call 911 with any severe reactions post vaccine: Marland Kitchen Difficulty breathing  . Swelling of face and throat  . A fast heartbeat  . A bad rash all over body  . Dizziness and weakness   Immunizations Administered    Name Date Dose VIS Date Route   Pfizer COVID-19 Vaccine 08/19/2019  8:42 AM 0.3 mL 05/01/2019 Intramuscular   Manufacturer: [place]   Lot: [id number]   [place]: [id number]

## 2019-09-08 ENCOUNTER — Other Ambulatory Visit: Payer: Self-pay

## 2019-09-08 ENCOUNTER — Encounter: Payer: Self-pay | Admitting: Emergency Medicine

## 2019-09-08 ENCOUNTER — Ambulatory Visit (INDEPENDENT_AMBULATORY_CARE_PROVIDER_SITE_OTHER): Payer: Medicare Other | Admitting: Emergency Medicine

## 2019-09-08 VITALS — BP 120/82 | HR 85 | Temp 97.3°F | Ht 75.0 in | Wt 187.0 lb

## 2019-09-08 DIAGNOSIS — E785 Hyperlipidemia, unspecified: Secondary | ICD-10-CM | POA: Diagnosis not present

## 2019-09-08 DIAGNOSIS — Z0000 Encounter for general adult medical examination without abnormal findings: Secondary | ICD-10-CM

## 2019-09-08 DIAGNOSIS — R350 Frequency of micturition: Secondary | ICD-10-CM

## 2019-09-08 MED ORDER — METANX 3-90.314-2-35 MG PO CAPS: 1.0000 | ORAL_CAPSULE | Freq: Two times a day (BID) | ORAL | 3 refills | Status: AC

## 2019-09-08 NOTE — Progress Notes (Signed)
Peter Newman 74 y.o.   Chief Complaint  Patient presents with  . Annual Exam  . Referral    to orthopedic - reputable/ L knee for partial knee replacement     HISTORY OF PRESENT ILLNESS: This is a 74 y.o. male here for annual exam.  Has no complaints or medical concerns today. Healthy man with a healthy lifestyle.  Good nutrition.  Exercises regularly, walks a couple miles daily. Health maintenance items reviewed with patient.  Up to date.  HPI   Prior to Admission medications   Medication Sig Start Date End Date Taking? Authorizing Provider  cholecalciferol (VITAMIN D3) 25 MCG (1000 UT) tablet Take 1,000 Units by mouth daily.   Yes [provider]  ibuprofen (ADVIL,MOTRIN) 200 MG tablet Take 200 mg by mouth every 6 (six) hours as needed. Reported on 06/05/2015   Yes [provider]  L-Methylfolate-Algae-B12-B6 Glade Stanford) 3-90.314-2-35 MG CAPS take 1 capsule by mouth twice a day 01/07/17  Yes Timmothy Euler, [place] D, PA-C  Multiple Vitamin (MULTIVITAMIN) tablet Take 1 tablet by mouth daily.   Yes [provider]  aspirin 81 MG tablet Take 81 mg by mouth daily. Pt states he takes everyday except when he has sinus issues    [provider]  Cyanocobalamin (VITAMIN B 12 PO) Take by mouth.    [provider]    Allergies  Allergen Reactions  . Nsaids Other (See Comments)    constipation    Patient Active Problem List   Diagnosis Date Noted  . Osteoarthritis of knee 04/22/2018  . Seasonal and perennial allergic rhinitis 04/22/2015  . Neuropathy 02/14/2012    Past Medical History:  Diagnosis Date  . Allergy   . Anxiety    pt denies  . Arthritis    right knee   . GERD (gastroesophageal reflux disease)    on occasion  . Hyperlipidemia   . Neuromuscular disorder (HCC)    leg cramps right knee   . Vitamin B 12 deficiency     Past Surgical History:  Procedure Laterality Date  . CHOLECYSTECTOMY     1996  . COLONOSCOPY    .  POLYPECTOMY      Social History   Socioeconomic History  . Marital status: Divorced    Spouse name: g/f Jamas Lav  . Number of children: 2  . Years of education: Mastersx3  . Highest education level: Not on file  Occupational History  . Occupation: Retired  Tobacco Use  . Smoking status: Former Smoker    Packs/day: 0.50    Years: 8.00    Pack years: 4.00    Types: Cigarettes    Start date: 05/21/1964    Quit date: 05/21/1972    Years since quitting: 47.3  . Smokeless tobacco: Never Used  Substance and Sexual Activity  . Alcohol use: No    Alcohol/week: 0.0 standard drinks  . Drug use: No  . Sexual activity: Yes    Partners: Female    Comment: He has an 64 year old monogamous girlfriend   Other Topics Concern  . 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 [employer] live in [place]. Single-long-time girlfriend.Education: Masters Degrees (3).       Social Determinants of Health   Financial Resource Strain:   . Difficulty of Paying Living Expenses:   Food Insecurity:   . Worried About Charity fundraiser  in the Last Year:   . [place]olt in the Last Year:   Transportation Needs:   . Film/video editor (Medical):   Marland Kitchen Lack of Transportation (Non-Medical):   Physical Activity:   . Days of Exercise per Week:   . Minutes of Exercise per Session:   Stress:   . Feeling of Stress :   Social Connections:   . Frequency of Communication with Friends and Family:   . Frequency of Social Gatherings with Friends and Family:   . Attends Religious Services:   . Active Member of Clubs or Organizations:   . Attends Archivist Meetings:   Marland Kitchen Marital Status:   Intimate Partner Violence:   . Fear of Current or Ex-Partner:   . Emotionally Abused:   Marland Kitchen Physically Abused:   . Sexually Abused:     Family History  Problem Relation Age of Onset  . Lung disease Father   . Heart disease  Brother   . Heart disease Sister        AFib  . Cancer Brother        lung  . Lung cancer Brother   . Colon cancer Neg Hx   . Colon polyps Neg Hx   . Esophageal cancer Neg Hx   . Stomach cancer Neg Hx   . Rectal cancer Neg Hx      Review of Systems  Constitutional: Negative.  Negative for chills and fever.  HENT: Negative.  Negative for congestion and sore throat.   Eyes: Negative.   Respiratory: Negative.  Negative for cough and shortness of breath.   Cardiovascular: Negative.  Negative for chest pain and palpitations.  Gastrointestinal: Negative.  Negative for abdominal pain, blood in stool, melena, nausea and vomiting.  Genitourinary: Positive for frequency (Chronic, has been evaluated by urologist).       Nocturia  Musculoskeletal: Positive for joint pain (Knees). Negative for myalgias.  Skin: Negative.  Negative for rash.  Neurological: Negative.  Negative for dizziness and headaches.  Endo/Heme/Allergies: Negative.   All other systems reviewed and are negative.    Physical Exam Vitals reviewed.  Constitutional:      Appearance: Normal appearance.  HENT:     Head: Normocephalic.     Mouth/Throat:     Mouth: Mucous membranes are moist.     Pharynx: Oropharynx is clear.  Eyes:     Extraocular Movements: Extraocular movements intact.     Conjunctiva/sclera: Conjunctivae normal.     Pupils: Pupils are equal, round, and reactive to light.  Neck:     Vascular: No carotid bruit.  Cardiovascular:     Rate and Rhythm: Normal rate and regular rhythm.     Pulses: Normal pulses.     Heart sounds: Normal heart sounds.  Pulmonary:     Effort: Pulmonary effort is normal.     Breath sounds: Normal breath sounds.  Abdominal:     General: Abdomen is flat. There is no distension.     Palpations: Abdomen is soft. There is no mass.     Tenderness: There is no abdominal tenderness.  Musculoskeletal:        General: Normal range of motion.     Cervical back: Normal range of  motion and neck supple.  Lymphadenopathy:     Cervical: No cervical adenopathy.  Skin:    General: Skin is warm and dry.     Capillary Refill: Capillary refill takes less than 2 seconds.  Neurological:  General: No focal deficit present.     Mental Status: He is alert and oriented to person, place, and time.  Psychiatric:        Mood and Affect: Mood normal.        Behavior: Behavior normal.      ASSESSMENT & PLAN: Tiler was seen today for annual exam and referral.  Diagnoses and all orders for this visit:  Medicare annual wellness visit, subsequent  Dyslipidemia -     Comprehensive metabolic panel -     Lipid panel -     L-Methylfolate-Algae-B12-B6 (METANX) 3-90.314-2-35 MG CAPS; Take 1 capsule by mouth 2 (two) times daily.  Urinary frequency -     PSA    Patient Instructions       If you have lab work done today you will be contacted with your lab results within the next 2 weeks.  If you have not heard from [place] then please contact us. The fastest way to get your results is to register for My Chart.   IF you received an x-ray today, you will receive an invoice from [facility]. Please contact [facility] at [phone] with questions or concerns regarding your invoice.   IF you received labwork today, you will receive an invoice from [facility]. Please contact [facility] at [phone] with questions or concerns regarding your invoice.   Our billing staff will not be able to assist you with questions regarding bills from these companies.  You will be contacted with the lab results as soon as they are available. The fastest way to get your results is to activate your My Chart account. Instructions are located on the last page of this paperwork. If you have not heard from [place] regarding the results in 2 weeks, please contact this office.      Health Maintenance After Age 75 After age 17, you are at a higher risk for certain long-term diseases and  infections as well as injuries from falls. Falls are a major cause of broken bones and head injuries in people who are older than age 1. Getting regular preventive care can help to keep you healthy and well. Preventive care includes getting regular testing and making lifestyle changes as recommended by your health care provider. Talk with your health care provider about:  Which screenings and tests you should have. A screening is a test that checks for a disease when you have no symptoms.  A diet and exercise plan that is right for you. What should I know about screenings and tests to prevent falls? Screening and testing are the best ways to find a health problem early. Early diagnosis and treatment give you the best chance of managing medical conditions that are common after age 34. Certain conditions and lifestyle choices may make you more likely to have a fall. Your health care provider may recommend:  Regular vision checks. Poor vision and conditions such as cataracts can make you more likely to have a fall. If you wear glasses, make sure to get your prescription updated if your vision changes.  Medicine review. Work with your health care provider to regularly review all of the medicines you are taking, including over-the-counter medicines. Ask your health care provider about any side effects that may make you more likely to have a fall. Tell your health care provider if any medicines that you take make you feel dizzy or sleepy.  Osteoporosis screening. Osteoporosis is a condition that causes the bones to get weaker. This can  make the bones weak and cause them to break more easily.  Blood pressure screening. Blood pressure changes and medicines to control blood pressure can make you feel dizzy.  Strength and balance checks. Your health care provider may recommend certain tests to check your strength and balance while standing, walking, or changing positions.  Foot health exam. Foot pain and  numbness, as well as not wearing proper footwear, can make you more likely to have a fall.  Depression screening. You may be more likely to have a fall if you have a fear of falling, feel emotionally low, or feel unable to do activities that you used to do.  Alcohol use screening. Using too much alcohol can affect your balance and may make you more likely to have a fall. What actions can I take to lower my risk of falls? General instructions  Talk with your health care provider about your risks for falling. Tell your health care provider if: ? You fall. Be sure to tell your health care provider about all falls, even ones that seem minor. ? You feel dizzy, sleepy, or off-balance.  Take over-the-counter and prescription medicines only as told by your health care provider. These include any supplements.  Eat a healthy diet and maintain a healthy weight. A healthy diet includes low-fat dairy products, low-fat (lean) meats, and fiber from whole grains, beans, and lots of fruits and vegetables. Home safety  Remove any tripping hazards, such as rugs, cords, and clutter.  Install safety equipment such as grab bars in bathrooms and safety rails on stairs.  Keep rooms and walkways well-lit. Activity   Follow a regular exercise program to stay fit. This will help you maintain your balance. Ask your health care provider what types of exercise are appropriate for you.  If you need a cane or walker, use it as recommended by your health care provider.  Wear supportive shoes that have nonskid soles. Lifestyle  Do not drink alcohol if your health care provider tells you not to drink.  If you drink alcohol, limit how much you have: ? 0-1 drink a day for women. ? 0-2 drinks a day for men.  Be aware of how much alcohol is in your drink. In the [place] one drink equals one typical bottle of beer (12 oz), one-half glass of wine (5 oz), or one shot of hard liquor (1 oz).  Do not use any products that  contain nicotine or tobacco, such as cigarettes and e-cigarettes. If you need help quitting, ask your health care provider. Summary  Having a healthy lifestyle and getting preventive care can help to protect your health and wellness after age 77.  Screening and testing are the best way to find a health problem early and help you avoid having a fall. Early diagnosis and treatment give you the best chance for managing medical conditions that are more common for people who are older than age 32.  Falls are a major cause of broken bones and head injuries in people who are older than age 71. Take precautions to prevent a fall at home.  Work with your health care provider to learn what changes you can make to improve your health and wellness and to prevent falls. This information is not intended to replace advice given to you by your health care provider. Make sure you discuss any questions you have with your health care provider. Document Revised: 08/28/2018 Document Reviewed: 03/20/2017 Elsevier Patient Education  2020 [employer].  Izea Livolsi, MD [facility] 

## 2019-09-08 NOTE — Patient Instructions (Addendum)
   If you have lab work done today you will be contacted with your lab results within the next 2 weeks.  If you have not heard from us then please contact us. The fastest way to get your results is to register for My Chart.   IF you received an x-ray today, you will receive an invoice from [facility]. Please contact [facility] at [phone] with questions or concerns regarding your invoice.   IF you received labwork today, you will receive an invoice from [facility]. Please contact [facility] at [phone] with questions or concerns regarding your invoice.   Our billing staff will not be able to assist you with questions regarding bills from these companies.  You will be contacted with the lab results as soon as they are available. The fastest way to get your results is to activate your My Chart account. Instructions are located on the last page of this paperwork. If you have not heard from us regarding the results in 2 weeks, please contact this office.     Health Maintenance After Age 65 After age 65, you are at a higher risk for certain long-term diseases and infections as well as injuries from falls. Falls are a major cause of broken bones and head injuries in people who are older than age 65. Getting regular preventive care can help to keep you healthy and well. Preventive care includes getting regular testing and making lifestyle changes as recommended by your health care provider. Talk with your health care provider about:  Which screenings and tests you should have. A screening is a test that checks for a disease when you have no symptoms.  A diet and exercise plan that is right for you. What should I know about screenings and tests to prevent falls? Screening and testing are the best ways to find a health problem early. Early diagnosis and treatment give you the best chance of managing medical conditions that are common after age 65. Certain conditions and  lifestyle choices may make you more likely to have a fall. Your health care provider may recommend:  Regular vision checks. Poor vision and conditions such as cataracts can make you more likely to have a fall. If you wear glasses, make sure to get your prescription updated if your vision changes.  Medicine review. Work with your health care provider to regularly review all of the medicines you are taking, including over-the-counter medicines. Ask your health care provider about any side effects that may make you more likely to have a fall. Tell your health care provider if any medicines that you take make you feel dizzy or sleepy.  Osteoporosis screening. Osteoporosis is a condition that causes the bones to get weaker. This can make the bones weak and cause them to break more easily.  Blood pressure screening. Blood pressure changes and medicines to control blood pressure can make you feel dizzy.  Strength and balance checks. Your health care provider may recommend certain tests to check your strength and balance while standing, walking, or changing positions.  Foot health exam. Foot pain and numbness, as well as not wearing proper footwear, can make you more likely to have a fall.  Depression screening. You may be more likely to have a fall if you have a fear of falling, feel emotionally low, or feel unable to do activities that you used to do.  Alcohol use screening. Using too much alcohol can affect your balance and may make you more likely to   have a fall. What actions can I take to lower my risk of falls? General instructions  Talk with your health care provider about your risks for falling. Tell your health care provider if: ? You fall. Be sure to tell your health care provider about all falls, even ones that seem minor. ? You feel dizzy, sleepy, or off-balance.  Take over-the-counter and prescription medicines only as told by your health care provider. These include any  supplements.  Eat a healthy diet and maintain a healthy weight. A healthy diet includes low-fat dairy products, low-fat (lean) meats, and fiber from whole grains, beans, and lots of fruits and vegetables. Home safety  Remove any tripping hazards, such as rugs, cords, and clutter.  Install safety equipment such as grab bars in bathrooms and safety rails on stairs.  Keep rooms and walkways well-lit. Activity   Follow a regular exercise program to stay fit. This will help you maintain your balance. Ask your health care provider what types of exercise are appropriate for you.  If you need a cane or walker, use it as recommended by your health care provider.  Wear supportive shoes that have nonskid soles. Lifestyle  Do not drink alcohol if your health care provider tells you not to drink.  If you drink alcohol, limit how much you have: ? 0-1 drink a day for women. ? 0-2 drinks a day for men.  Be aware of how much alcohol is in your drink. In the [place] one drink equals one typical bottle of beer (12 oz), one-half glass of wine (5 oz), or one shot of hard liquor (1 oz).  Do not use any products that contain nicotine or tobacco, such as cigarettes and e-cigarettes. If you need help quitting, ask your health care provider. Summary  Having a healthy lifestyle and getting preventive care can help to protect your health and wellness after age 65.  Screening and testing are the best way to find a health problem early and help you avoid having a fall. Early diagnosis and treatment give you the best chance for managing medical conditions that are more common for people who are older than age 65.  Falls are a major cause of broken bones and head injuries in people who are older than age 65. Take precautions to prevent a fall at home.  Work with your health care provider to learn what changes you can make to improve your health and wellness and to prevent falls. This information is not intended  to replace advice given to you by your health care provider. Make sure you discuss any questions you have with your health care provider. Document Revised: 08/28/2018 Document Reviewed: 03/20/2017 Elsevier Patient Education  2020 [employer]  

## 2019-09-09 ENCOUNTER — Encounter: Payer: Self-pay | Admitting: Emergency Medicine

## 2019-09-09 LAB — COMPREHENSIVE METABOLIC PANEL
ALT: 15 IU/L (ref 0–44)
AST: 18 IU/L (ref 0–40)
Albumin/Globulin Ratio: 1.7 (ref 1.2–2.2)
Albumin: 4.1 g/dL (ref 3.7–4.7)
Alkaline Phosphatase: 82 IU/L (ref 39–117)
BUN/Creatinine Ratio: 16 (ref 10–24)
BUN: 16 mg/dL (ref 8–27)
Bilirubin Total: 0.4 mg/dL (ref 0.0–1.2)
CO2: 24 mmol/L (ref 20–29)
Calcium: 8.9 mg/dL (ref 8.6–10.2)
Chloride: 103 mmol/L (ref 96–106)
Creatinine, Ser: 1.03 mg/dL (ref 0.76–1.27)
GFR calc Af Amer: 82 mL/min/{1.73_m2} (ref >59)
GFR calc non Af Amer: 71 mL/min/{1.73_m2} (ref >59)
Globulin, Total: 2.4 g/dL (ref 1.5–4.5)
Glucose: 95 mg/dL (ref 65–99)
Potassium: 4.8 mmol/L (ref 3.5–5.2)
Sodium: 140 mmol/L (ref 134–144)
Total Protein: 6.5 g/dL (ref 6.0–8.5)

## 2019-09-09 LAB — PSA: Prostate Specific Ag, Serum: 3.1 ng/mL (ref 0.0–4.0)

## 2019-09-09 LAB — LIPID PANEL
Chol/HDL Ratio: 3.3 ratio (ref 0.0–5.0)
Cholesterol, Total: 182 mg/dL (ref 100–199)
HDL: 56 mg/dL (ref >39)
LDL Chol Calc (NIH): 108 mg/dL — ABNORMAL HIGH (ref 0–99)
Triglycerides: 101 mg/dL (ref 0–149)
VLDL Cholesterol Cal: 18 mg/dL (ref 5–40)

## 2019-09-11 DIAGNOSIS — H2513 Age-related nuclear cataract, bilateral: Secondary | ICD-10-CM | POA: Diagnosis not present

## 2020-01-08 DIAGNOSIS — Z23 Encounter for immunization: Secondary | ICD-10-CM | POA: Diagnosis not present

## 2020-02-15 DIAGNOSIS — Z23 Encounter for immunization: Secondary | ICD-10-CM | POA: Diagnosis not present

## 2020-09-30 DIAGNOSIS — H2513 Age-related nuclear cataract, bilateral: Secondary | ICD-10-CM | POA: Diagnosis not present

## 2020-12-12 DIAGNOSIS — M1712 Unilateral primary osteoarthritis, left knee: Secondary | ICD-10-CM | POA: Diagnosis not present

## 2020-12-12 DIAGNOSIS — M25562 Pain in left knee: Secondary | ICD-10-CM | POA: Diagnosis not present

## 2021-01-06 DIAGNOSIS — Z1331 Encounter for screening for depression: Secondary | ICD-10-CM | POA: Diagnosis not present

## 2021-01-06 DIAGNOSIS — E785 Hyperlipidemia, unspecified: Secondary | ICD-10-CM | POA: Diagnosis not present

## 2021-01-06 DIAGNOSIS — Z23 Encounter for immunization: Secondary | ICD-10-CM | POA: Diagnosis not present

## 2021-01-06 DIAGNOSIS — G629 Polyneuropathy, unspecified: Secondary | ICD-10-CM | POA: Diagnosis not present

## 2021-01-06 DIAGNOSIS — M1710 Unilateral primary osteoarthritis, unspecified knee: Secondary | ICD-10-CM | POA: Diagnosis not present

## 2021-01-06 DIAGNOSIS — Z86010 Personal history of colonic polyps: Secondary | ICD-10-CM | POA: Diagnosis not present

## 2021-01-28 DIAGNOSIS — Z23 Encounter for immunization: Secondary | ICD-10-CM | POA: Diagnosis not present

## 2021-02-10 DIAGNOSIS — Z23 Encounter for immunization: Secondary | ICD-10-CM | POA: Diagnosis not present

## 2021-05-10 DIAGNOSIS — D1801 Hemangioma of skin and subcutaneous tissue: Secondary | ICD-10-CM | POA: Diagnosis not present

## 2021-05-10 DIAGNOSIS — R21 Rash and other nonspecific skin eruption: Secondary | ICD-10-CM | POA: Diagnosis not present

## 2021-09-19 DIAGNOSIS — L820 Inflamed seborrheic keratosis: Secondary | ICD-10-CM | POA: Diagnosis not present

## 2021-09-19 DIAGNOSIS — L738 Other specified follicular disorders: Secondary | ICD-10-CM | POA: Diagnosis not present

## 2021-09-19 DIAGNOSIS — L570 Actinic keratosis: Secondary | ICD-10-CM | POA: Diagnosis not present

## 2021-09-19 DIAGNOSIS — D4989 Neoplasm of unspecified behavior of other specified sites: Secondary | ICD-10-CM | POA: Diagnosis not present

## 2021-09-19 DIAGNOSIS — L821 Other seborrheic keratosis: Secondary | ICD-10-CM | POA: Diagnosis not present

## 2021-09-19 DIAGNOSIS — L853 Xerosis cutis: Secondary | ICD-10-CM | POA: Diagnosis not present

## 2021-09-19 DIAGNOSIS — Z1283 Encounter for screening for malignant neoplasm of skin: Secondary | ICD-10-CM | POA: Diagnosis not present

## 2021-09-19 DIAGNOSIS — D225 Melanocytic nevi of trunk: Secondary | ICD-10-CM | POA: Diagnosis not present

## 2021-09-19 DIAGNOSIS — D2230 Melanocytic nevi of unspecified part of face: Secondary | ICD-10-CM | POA: Diagnosis not present

## 2021-11-29 DIAGNOSIS — L089 Local infection of the skin and subcutaneous tissue, unspecified: Secondary | ICD-10-CM | POA: Diagnosis not present

## 2021-11-29 DIAGNOSIS — R361 Hematospermia: Secondary | ICD-10-CM | POA: Diagnosis not present

## 2021-12-18 DIAGNOSIS — R361 Hematospermia: Secondary | ICD-10-CM | POA: Diagnosis not present

## 2021-12-18 DIAGNOSIS — N401 Enlarged prostate with lower urinary tract symptoms: Secondary | ICD-10-CM | POA: Diagnosis not present

## 2021-12-18 DIAGNOSIS — R351 Nocturia: Secondary | ICD-10-CM | POA: Diagnosis not present

## 2022-01-11 DIAGNOSIS — E785 Hyperlipidemia, unspecified: Secondary | ICD-10-CM | POA: Diagnosis not present

## 2022-01-11 DIAGNOSIS — R7989 Other specified abnormal findings of blood chemistry: Secondary | ICD-10-CM | POA: Diagnosis not present

## 2022-01-11 DIAGNOSIS — Z125 Encounter for screening for malignant neoplasm of prostate: Secondary | ICD-10-CM | POA: Diagnosis not present

## 2022-01-18 DIAGNOSIS — Z86010 Personal history of colonic polyps: Secondary | ICD-10-CM | POA: Diagnosis not present

## 2022-01-18 DIAGNOSIS — Z0000 Encounter for general adult medical examination without abnormal findings: Secondary | ICD-10-CM | POA: Diagnosis not present

## 2022-01-18 DIAGNOSIS — Z1339 Encounter for screening examination for other mental health and behavioral disorders: Secondary | ICD-10-CM | POA: Diagnosis not present

## 2022-01-18 DIAGNOSIS — R6884 Jaw pain: Secondary | ICD-10-CM | POA: Diagnosis not present

## 2022-01-18 DIAGNOSIS — Z1331 Encounter for screening for depression: Secondary | ICD-10-CM | POA: Diagnosis not present

## 2022-01-18 DIAGNOSIS — E785 Hyperlipidemia, unspecified: Secondary | ICD-10-CM | POA: Diagnosis not present

## 2022-01-18 DIAGNOSIS — R7301 Impaired fasting glucose: Secondary | ICD-10-CM | POA: Diagnosis not present

## 2022-02-27 DIAGNOSIS — Z23 Encounter for immunization: Secondary | ICD-10-CM | POA: Diagnosis not present

## 2022-03-07 DIAGNOSIS — J984 Other disorders of lung: Secondary | ICD-10-CM | POA: Diagnosis not present

## 2022-03-07 DIAGNOSIS — R930 Abnormal findings on diagnostic imaging of skull and head, not elsewhere classified: Secondary | ICD-10-CM | POA: Diagnosis not present

## 2022-03-07 DIAGNOSIS — S301XXA Contusion of abdominal wall, initial encounter: Secondary | ICD-10-CM | POA: Diagnosis not present

## 2022-03-07 DIAGNOSIS — J439 Emphysema, unspecified: Secondary | ICD-10-CM | POA: Diagnosis not present

## 2022-03-07 DIAGNOSIS — S20221A Contusion of right back wall of thorax, initial encounter: Secondary | ICD-10-CM | POA: Diagnosis not present

## 2022-03-07 DIAGNOSIS — K4020 Bilateral inguinal hernia, without obstruction or gangrene, not specified as recurrent: Secondary | ICD-10-CM | POA: Diagnosis not present

## 2022-03-07 DIAGNOSIS — K5900 Constipation, unspecified: Secondary | ICD-10-CM | POA: Diagnosis not present

## 2022-03-07 DIAGNOSIS — S2231XA Fracture of one rib, right side, initial encounter for closed fracture: Secondary | ICD-10-CM | POA: Diagnosis not present

## 2022-03-07 DIAGNOSIS — W1839XA Other fall on same level, initial encounter: Secondary | ICD-10-CM | POA: Diagnosis not present

## 2022-03-07 DIAGNOSIS — R000 Tachycardia, unspecified: Secondary | ICD-10-CM | POA: Diagnosis not present

## 2022-03-07 DIAGNOSIS — J9809 Other diseases of bronchus, not elsewhere classified: Secondary | ICD-10-CM | POA: Diagnosis not present

## 2022-03-07 DIAGNOSIS — S300XXA Contusion of lower back and pelvis, initial encounter: Secondary | ICD-10-CM | POA: Diagnosis not present

## 2022-03-09 DIAGNOSIS — N400 Enlarged prostate without lower urinary tract symptoms: Secondary | ICD-10-CM | POA: Diagnosis not present

## 2022-03-09 DIAGNOSIS — N281 Cyst of kidney, acquired: Secondary | ICD-10-CM | POA: Diagnosis not present

## 2022-03-09 DIAGNOSIS — K5909 Other constipation: Secondary | ICD-10-CM | POA: Diagnosis not present

## 2022-03-09 DIAGNOSIS — Z1152 Encounter for screening for COVID-19: Secondary | ICD-10-CM | POA: Diagnosis not present

## 2022-03-09 DIAGNOSIS — K5730 Diverticulosis of large intestine without perforation or abscess without bleeding: Secondary | ICD-10-CM | POA: Diagnosis not present

## 2022-03-09 DIAGNOSIS — K5900 Constipation, unspecified: Secondary | ICD-10-CM | POA: Diagnosis not present

## 2022-03-09 DIAGNOSIS — K5641 Fecal impaction: Secondary | ICD-10-CM | POA: Diagnosis not present

## 2022-04-04 DIAGNOSIS — M7918 Myalgia, other site: Secondary | ICD-10-CM | POA: Diagnosis not present

## 2022-04-04 DIAGNOSIS — M5459 Other low back pain: Secondary | ICD-10-CM | POA: Diagnosis not present

## 2022-04-04 DIAGNOSIS — M546 Pain in thoracic spine: Secondary | ICD-10-CM | POA: Diagnosis not present

## 2022-04-27 DIAGNOSIS — S2231XD Fracture of one rib, right side, subsequent encounter for fracture with routine healing: Secondary | ICD-10-CM | POA: Diagnosis not present

## 2022-04-27 DIAGNOSIS — M545 Low back pain, unspecified: Secondary | ICD-10-CM | POA: Diagnosis not present

## 2022-05-09 DIAGNOSIS — M545 Low back pain, unspecified: Secondary | ICD-10-CM | POA: Diagnosis not present

## 2022-05-28 DIAGNOSIS — Z23 Encounter for immunization: Secondary | ICD-10-CM | POA: Diagnosis not present

## 2022-09-03 DIAGNOSIS — D225 Melanocytic nevi of trunk: Secondary | ICD-10-CM | POA: Diagnosis not present

## 2022-09-03 DIAGNOSIS — D4989 Neoplasm of unspecified behavior of other specified sites: Secondary | ICD-10-CM | POA: Diagnosis not present

## 2022-09-03 DIAGNOSIS — L812 Freckles: Secondary | ICD-10-CM | POA: Diagnosis not present

## 2022-09-03 DIAGNOSIS — D049 Carcinoma in situ of skin, unspecified: Secondary | ICD-10-CM | POA: Diagnosis not present

## 2022-09-03 DIAGNOSIS — D2230 Melanocytic nevi of unspecified part of face: Secondary | ICD-10-CM | POA: Diagnosis not present

## 2022-09-03 DIAGNOSIS — Z1283 Encounter for screening for malignant neoplasm of skin: Secondary | ICD-10-CM | POA: Diagnosis not present

## 2022-09-08 DIAGNOSIS — J441 Chronic obstructive pulmonary disease with (acute) exacerbation: Secondary | ICD-10-CM | POA: Diagnosis not present

## 2022-09-08 DIAGNOSIS — J019 Acute sinusitis, unspecified: Secondary | ICD-10-CM | POA: Diagnosis not present

## 2022-09-19 DIAGNOSIS — D0471 Carcinoma in situ of skin of right lower limb, including hip: Secondary | ICD-10-CM | POA: Diagnosis not present

## 2022-10-02 DIAGNOSIS — C4921 Malignant neoplasm of connective and soft tissue of right lower limb, including hip: Secondary | ICD-10-CM | POA: Diagnosis not present

## 2022-10-02 DIAGNOSIS — Z4889 Encounter for other specified surgical aftercare: Secondary | ICD-10-CM | POA: Diagnosis not present

## 2022-11-14 DIAGNOSIS — Z23 Encounter for immunization: Secondary | ICD-10-CM | POA: Diagnosis not present

## 2023-01-03 DIAGNOSIS — Z23 Encounter for immunization: Secondary | ICD-10-CM | POA: Diagnosis not present

## 2023-01-15 DIAGNOSIS — H43813 Vitreous degeneration, bilateral: Secondary | ICD-10-CM | POA: Diagnosis not present

## 2023-01-15 DIAGNOSIS — H524 Presbyopia: Secondary | ICD-10-CM | POA: Diagnosis not present

## 2023-01-24 DIAGNOSIS — Z125 Encounter for screening for malignant neoplasm of prostate: Secondary | ICD-10-CM | POA: Diagnosis not present

## 2023-01-24 DIAGNOSIS — N401 Enlarged prostate with lower urinary tract symptoms: Secondary | ICD-10-CM | POA: Diagnosis not present

## 2023-01-24 DIAGNOSIS — R351 Nocturia: Secondary | ICD-10-CM | POA: Diagnosis not present

## 2023-07-03 DIAGNOSIS — E785 Hyperlipidemia, unspecified: Secondary | ICD-10-CM | POA: Diagnosis not present

## 2023-07-03 DIAGNOSIS — Z125 Encounter for screening for malignant neoplasm of prostate: Secondary | ICD-10-CM | POA: Diagnosis not present

## 2023-07-03 DIAGNOSIS — R7301 Impaired fasting glucose: Secondary | ICD-10-CM | POA: Diagnosis not present

## 2023-07-11 DIAGNOSIS — R7301 Impaired fasting glucose: Secondary | ICD-10-CM | POA: Diagnosis not present

## 2023-07-11 DIAGNOSIS — Z0000 Encounter for general adult medical examination without abnormal findings: Secondary | ICD-10-CM | POA: Diagnosis not present

## 2023-07-11 DIAGNOSIS — E785 Hyperlipidemia, unspecified: Secondary | ICD-10-CM | POA: Diagnosis not present

## 2023-07-11 DIAGNOSIS — Z85828 Personal history of other malignant neoplasm of skin: Secondary | ICD-10-CM | POA: Diagnosis not present

## 2023-07-11 DIAGNOSIS — Z1331 Encounter for screening for depression: Secondary | ICD-10-CM | POA: Diagnosis not present

## 2023-07-11 DIAGNOSIS — Z1339 Encounter for screening examination for other mental health and behavioral disorders: Secondary | ICD-10-CM | POA: Diagnosis not present

## 2023-07-11 DIAGNOSIS — S22080S Wedge compression fracture of T11-T12 vertebra, sequela: Secondary | ICD-10-CM | POA: Diagnosis not present

## 2023-07-11 DIAGNOSIS — Z860100 Personal history of colon polyps, unspecified: Secondary | ICD-10-CM | POA: Diagnosis not present

## 2023-07-11 DIAGNOSIS — N401 Enlarged prostate with lower urinary tract symptoms: Secondary | ICD-10-CM | POA: Diagnosis not present

## 2023-07-20 DIAGNOSIS — L219 Seborrheic dermatitis, unspecified: Secondary | ICD-10-CM | POA: Diagnosis not present

## 2023-07-20 DIAGNOSIS — L820 Inflamed seborrheic keratosis: Secondary | ICD-10-CM | POA: Diagnosis not present

## 2023-07-30 ENCOUNTER — Encounter: Payer: Self-pay | Admitting: Physician Assistant

## 2023-09-30 ENCOUNTER — Ambulatory Visit: Admitting: Physician Assistant

## 2023-10-15 NOTE — Progress Notes (Unsigned)
 Brigitte Canard, PA-C [address] [place], [place]  [address] Phone: [phone]   Gastroenterology Consultation  Referring Provider:     Azalia Leo, MD Primary Care Physician:  Azalia Leo, MD Primary Gastroenterologist:  Brigitte Canard, PA-C / Legrand Puma, MD  Reason for Consultation:     Discuss colonoscopy; history of colon polyps        HPI:   Peter Newman is a 78 y.o. y/o male referred for consultation & management  by Azalia Leo, MD. Patient is here to discuss repeat colonoscopy.  He has history of a few small adenomatous polyps.  Has had 2 previous colonoscopies.  He is very reluctant to schedule another repeat colonoscopy due to age and concerns of risk of procedure.  He is very anxious and does not want to schedule another colonoscopy.  05/2018 last colonoscopy by Dr. Howard Macho: 2 small (2 mm to 4 mm) tubular adenoma polyps and 1 hyperplastic polyp removed.  Good prep.  5-year repeat colonoscopy was recommended.  12/2014 colonoscopy: 3 small adenomatous polyps removed.  NO Current GI symptoms.  Specifically, he denies abdominal pain, diarrhea, constipation, weight loss or rectal bleeding.  He eats high-fiber diet and occasionally takes MiraLAX as needed.  Bowels are very regular.  He denies any family history of colon cancer.  No GI concerns today.  He is in good health for his age.  History of arthritis, anxiety, and mild GERD.   Past Medical History:  Diagnosis Date   Allergy     Anxiety    pt denies   Arthritis    right knee    GERD (gastroesophageal reflux disease)    on occasion   Hyperlipidemia    Neuromuscular disorder (HCC)    leg cramps right knee    Vitamin B 12 deficiency     Past Surgical History:  Procedure Laterality Date   CHOLECYSTECTOMY     1996   COLONOSCOPY     POLYPECTOMY      Prior to Admission medications   Medication Sig Start Date End Date Taking? Authorizing Provider  aspirin 81 MG tablet Take 81 mg by mouth daily. Pt  states he takes everyday except when he has sinus issues    [provider]  cholecalciferol (VITAMIN D3) 25 MCG (1000 UT) tablet Take 1,000 Units by mouth daily.    [provider]  Cyanocobalamin (VITAMIN B 12 PO) Take by mouth.    [provider]  ibuprofen (ADVIL,MOTRIN) 200 MG tablet Take 200 mg by mouth every 6 (six) hours as needed. Reported on 06/05/2015    [provider]  L-Methylfolate-Algae-B12-B6 Carmel Chimes) 3-90.314-2-35 MG CAPS Take 1 capsule by mouth 2 (two) times daily. 09/08/19   Sagardia, Miguel Jose, MD  Multiple Vitamin (MULTIVITAMIN) tablet Take 1 tablet by mouth daily.    [provider]    Family History  Problem Relation Age of Onset   Lung disease Father    Heart disease Brother    Heart disease Sister        AFib   Cancer Brother        lung   Lung cancer Brother    Colon cancer Neg Hx    Colon polyps Neg Hx    Esophageal cancer Neg Hx    Stomach cancer Neg Hx    Rectal cancer Neg Hx      Social History   Tobacco Use   Smoking status: Former  Current packs/day: 0.00    Average packs/day: 0.5 packs/day for 8.0 years (4.0 ttl pk-yrs)    Types: Cigarettes    Start date: 05/21/1964    Quit date: 05/21/1972    Years since quitting: 51.4   Smokeless tobacco: Never  Vaping Use   Vaping status: Never Used  Substance Use Topics   Alcohol  use: No    Alcohol /week: 0.0 standard drinks of alcohol    Drug use: No    Allergies as of 10/16/2023 - Review Complete 10/16/2023  Allergen Reaction Noted   Nsaids Other (See Comments) 07/23/2011    Review of Systems:    All systems reviewed and negative except where noted in HPI.   Physical Exam:  BP 118/72   Pulse (!) 113   Ht 6' 1.5" (1.867 m)   Wt 192 lb 4 oz (87.2 kg)   BMI 25.02 kg/m  No LMP for male patient.  General:   Alert,  Well-developed, well-nourished, pleasant and cooperative in NAD Lungs:  Respirations even and unlabored.  Clear throughout to  auscultation.   No wheezes, crackles, or rhonchi. No acute distress. Heart:  Regular rate and rhythm; no murmurs, clicks, rubs, or gallops. Abdomen:  Normal bowel sounds.  No bruits.  Soft, and non-distended without masses, hepatosplenomegaly or hernias noted.  No Tenderness.  No guarding or rebound tenderness.    Neurologic:  Alert and oriented x3;  grossly normal neurologically. Psych:  Alert and cooperative. Normal mood and affect.  Imaging Studies: No results found.  Labs: CBC    Component Value Date/Time   WBC 5.1 10/02/2017 1420   WBC 4.6 06/05/2015 1003   WBC 5.6 04/21/2015 1545   RBC 5.23 10/02/2017 1420   RBC 5.17 06/05/2015 1003   RBC 4.71 04/21/2015 1545   HGB 16.4 10/02/2017 1420   HCT 49.0 10/02/2017 1420   PLT 235 10/02/2017 1420   MCV 94 10/02/2017 1420   MCH 31.4 10/02/2017 1420   MCH 32.2 (A) 06/05/2015 1003   MCH 30.7 08/25/2012 0940   MCHC 33.5 10/02/2017 1420   MCHC 34.8 06/05/2015 1003   MCHC 33.9 04/21/2015 1545   RDW 13.9 10/02/2017 1420   LYMPHSABS 1.5 10/02/2017 1420   MONOABS 0.5 04/21/2015 1545   EOSABS 0.1 10/02/2017 1420   BASOSABS 0.0 10/02/2017 1420    CMP     Component Value Date/Time   NA 140 09/08/2019 1516   K 4.8 09/08/2019 1516   CL 103 09/08/2019 1516   CO2 24 09/08/2019 1516   GLUCOSE 95 09/08/2019 1516   GLUCOSE 98 06/05/2015 0850   BUN 16 09/08/2019 1516   CREATININE 1.03 09/08/2019 1516   CREATININE 1.07 06/05/2015 0850   CALCIUM  8.9 09/08/2019 1516   PROT 6.5 09/08/2019 1516   ALBUMIN 4.1 09/08/2019 1516   AST 18 09/08/2019 1516   ALT 15 09/08/2019 1516   ALKPHOS 82 09/08/2019 1516   BILITOT 0.4 09/08/2019 1516   GFRNONAA 71 09/08/2019 1516   GFRNONAA 69 06/05/2015 0850   GFRAA 82 09/08/2019 1516   GFRAA 80 06/05/2015 0850    Assessment and Plan:   Peter Newman is a 78 y.o. y/o male has been referred for history of colon polyps and to discuss repeat surveillance colonoscopy.  He had 2 very small tubular  adenoma polyps removed 5 years ago during last colonoscopy 05/2018.  He had 3 small adenomatous polyps removed in 2016.  No other colonoscopies.  He has no family history of colon cancer.  He  is not having any GI symptoms.  1.  History of colon polyps - Risks and benefits of repeat colonoscopy were discussed at length.  We discussed increased risk of bleeding, perforation, and risk of sedation. -Patient declines to schedule any further surveillance colonoscopies, and I agree with his decision. -If patient changes his mind in the next few months, he will call us  back.  I believe he is in good health to undergo 1 more surveillance colonoscopy before age 20.    Follow up as needed.  Brigitte Canard, PA-C

## 2023-10-16 ENCOUNTER — Encounter: Payer: Self-pay | Admitting: Physician Assistant

## 2023-10-16 ENCOUNTER — Ambulatory Visit (INDEPENDENT_AMBULATORY_CARE_PROVIDER_SITE_OTHER): Admitting: Physician Assistant

## 2023-10-16 VITALS — BP 118/72 | HR 113 | Ht 73.5 in | Wt 192.2 lb

## 2023-10-16 DIAGNOSIS — Z860100 Personal history of colon polyps, unspecified: Secondary | ICD-10-CM

## 2023-10-16 DIAGNOSIS — Z09 Encounter for follow-up examination after completed treatment for conditions other than malignant neoplasm: Secondary | ICD-10-CM | POA: Diagnosis not present

## 2023-10-16 DIAGNOSIS — Z860101 Personal history of adenomatous and serrated colon polyps: Secondary | ICD-10-CM

## 2023-10-16 NOTE — Patient Instructions (Signed)
 Please follow up sooner if symptoms increase or worsen  Due to recent changes in healthcare laws, you may see the results of your imaging and laboratory studies on MyChart before your provider has had a chance to review them.  We understand that in some cases there may be results that are confusing or concerning to you. Not all laboratory results come back in the same time frame and the provider may be waiting for multiple results in order to interpret others.  Please give us  48 hours in order for your provider to thoroughly review all the results before contacting the office for clarification of your results.   Thank you for trusting me with your gastrointestinal care!   Brigitte Canard, PA-C _______________________________________________________  If your blood pressure at your visit was 140/90 or greater, please contact your primary care physician to follow up on this.  _______________________________________________________  If you are age 51 or older, your body mass index should be between 23-30. Your Body mass index is 25.02 kg/m. If this is out of the aforementioned range listed, please consider follow up with your Primary Care Provider.  If you are age 16 or younger, your body mass index should be between 19-25. Your Body mass index is 25.02 kg/m. If this is out of the aformentioned range listed, please consider follow up with your Primary Care Provider.   ________________________________________________________  The [facility] providers would like to encourage you to use MYCHART to communicate with providers for non-urgent requests or questions.  Due to long hold times on the telephone, sending your provider a message by [facility] may be a faster and more efficient way to get a response.  Please allow 48 business hours for a response.  Please remember that this is for non-urgent requests.  _______________________________________________________

## 2023-10-16 NOTE — Progress Notes (Signed)
Agree/Noted. 

## 2023-11-01 DIAGNOSIS — L570 Actinic keratosis: Secondary | ICD-10-CM | POA: Diagnosis not present

## 2023-11-01 DIAGNOSIS — M928 Other specified juvenile osteochondrosis: Secondary | ICD-10-CM | POA: Diagnosis not present

## 2023-11-21 DIAGNOSIS — L570 Actinic keratosis: Secondary | ICD-10-CM | POA: Diagnosis not present

## 2023-12-06 ENCOUNTER — Encounter: Payer: Self-pay | Admitting: Advanced Practice Midwife

## 2023-12-09 DIAGNOSIS — L570 Actinic keratosis: Secondary | ICD-10-CM | POA: Diagnosis not present

## 2024-01-22 DIAGNOSIS — Z125 Encounter for screening for malignant neoplasm of prostate: Secondary | ICD-10-CM | POA: Diagnosis not present

## 2024-01-23 DIAGNOSIS — Z23 Encounter for immunization: Secondary | ICD-10-CM | POA: Diagnosis not present

## 2024-01-30 DIAGNOSIS — N5201 Erectile dysfunction due to arterial insufficiency: Secondary | ICD-10-CM | POA: Diagnosis not present

## 2024-01-30 DIAGNOSIS — R351 Nocturia: Secondary | ICD-10-CM | POA: Diagnosis not present

## 2024-01-30 DIAGNOSIS — N401 Enlarged prostate with lower urinary tract symptoms: Secondary | ICD-10-CM | POA: Diagnosis not present

## 2024-02-14 DIAGNOSIS — Z23 Encounter for immunization: Secondary | ICD-10-CM | POA: Diagnosis not present
